# Patient Record
Sex: Male | Born: 1965
Health system: Southern US, Community
[De-identification: ages and names within clinical notes are randomized; demographics above are authoritative.]

## PROBLEM LIST (undated history)

## (undated) DIAGNOSIS — M5136 Other intervertebral disc degeneration, lumbar region: Secondary | ICD-10-CM

## (undated) DIAGNOSIS — F419 Anxiety disorder, unspecified: Secondary | ICD-10-CM

## (undated) DIAGNOSIS — M51369 Other intervertebral disc degeneration, lumbar region without mention of lumbar back pain or lower extremity pain: Secondary | ICD-10-CM

## (undated) DIAGNOSIS — L0292 Furuncle, unspecified: Secondary | ICD-10-CM

## (undated) DIAGNOSIS — F329 Major depressive disorder, single episode, unspecified: Secondary | ICD-10-CM

## (undated) DIAGNOSIS — M5126 Other intervertebral disc displacement, lumbar region: Secondary | ICD-10-CM

## (undated) DIAGNOSIS — F32A Depression, unspecified: Secondary | ICD-10-CM

## (undated) HISTORY — DX: Other intervertebral disc displacement, lumbar region: M51.26

## (undated) HISTORY — DX: Depression, unspecified: F32.A

## (undated) HISTORY — DX: Major depressive disorder, single episode, unspecified: F32.9

## (undated) HISTORY — PX: URETHRA SURGERY: SHX824

## (undated) HISTORY — DX: Furuncle, unspecified: L02.92

## (undated) HISTORY — DX: Other intervertebral disc degeneration, lumbar region: M51.36

## (undated) HISTORY — DX: Other intervertebral disc degeneration, lumbar region without mention of lumbar back pain or lower extremity pain: M51.369

## (undated) HISTORY — DX: Anxiety disorder, unspecified: F41.9

---

## 2009-08-24 ENCOUNTER — Emergency Department: Payer: Self-pay | Admitting: Emergency Medicine

## 2010-11-05 IMAGING — CT CT ABD-PELV W/ CM
1 of 3 series · 14 of 32 positions shown, 19 images · non-contrast
Comparison: none

REASON FOR EXAM: (1) RLQ abd pain; (2) Right pelvic pain
COMMENTS:

[Series 2: soft tissue · axial · 0.74mm/px · z∈[-498,-92]mm · 14 of 93 slices shown, 19 images]
[im 6/93  soft-tissue]
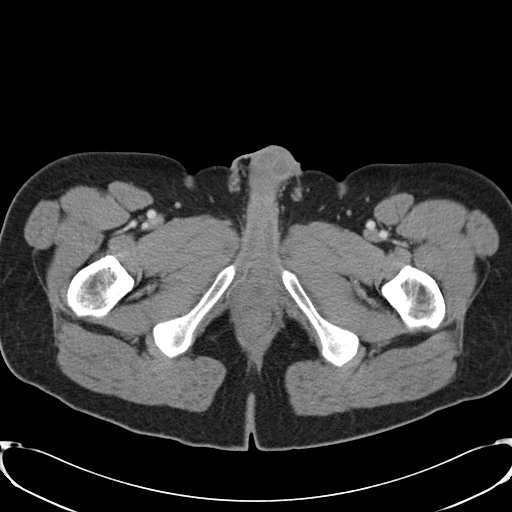
[im 6/93  bone]
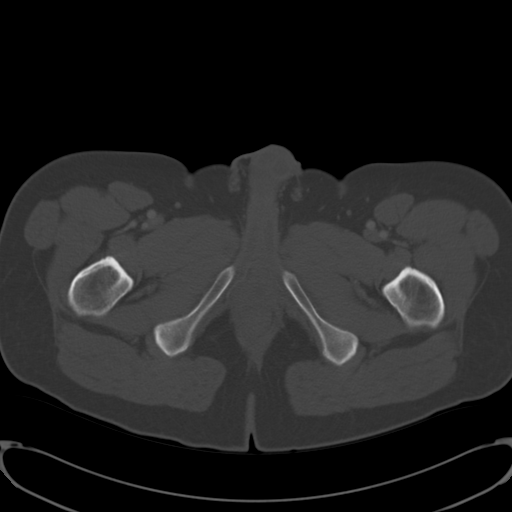
[im 11/93  soft-tissue]
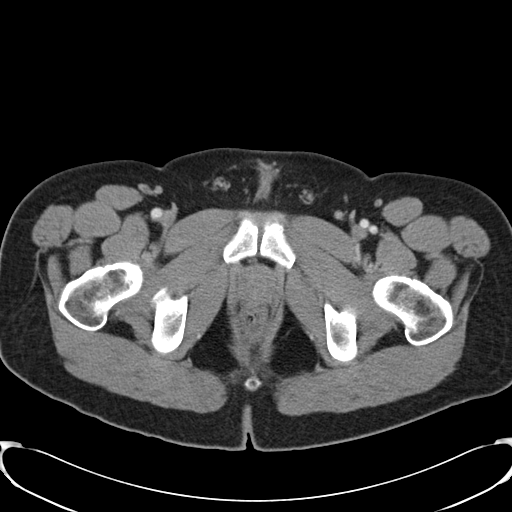
[im 22/93  soft-tissue]
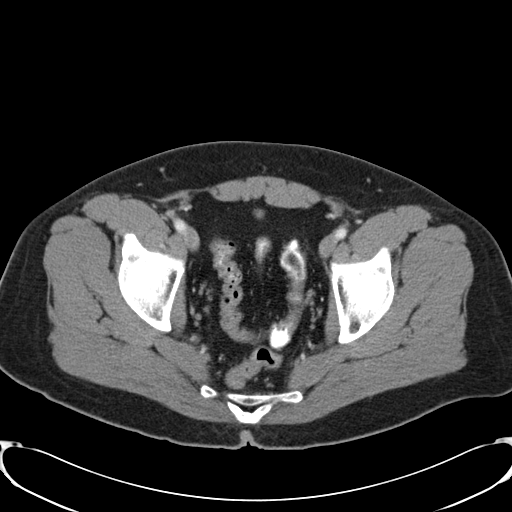
[im 28/93  soft-tissue]
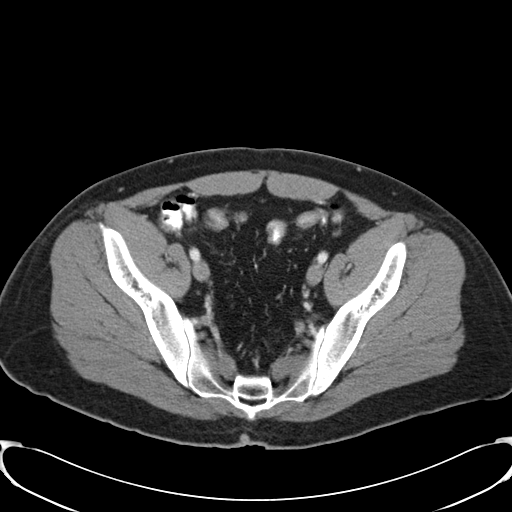
[im 33/93  soft-tissue]
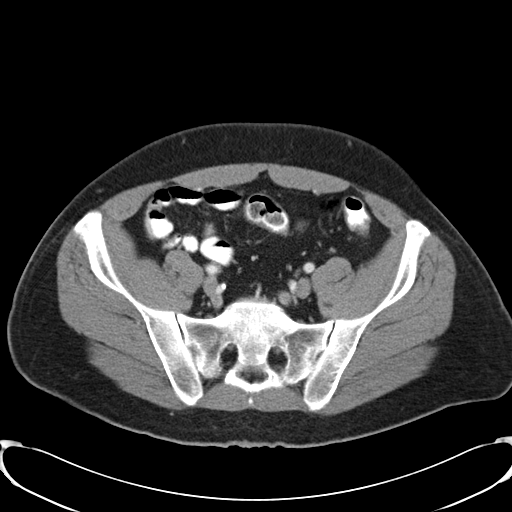
[im 38/93  soft-tissue]
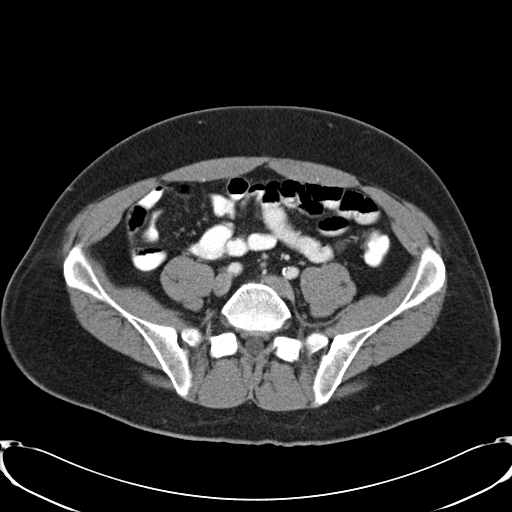
[im 49/93  soft-tissue]
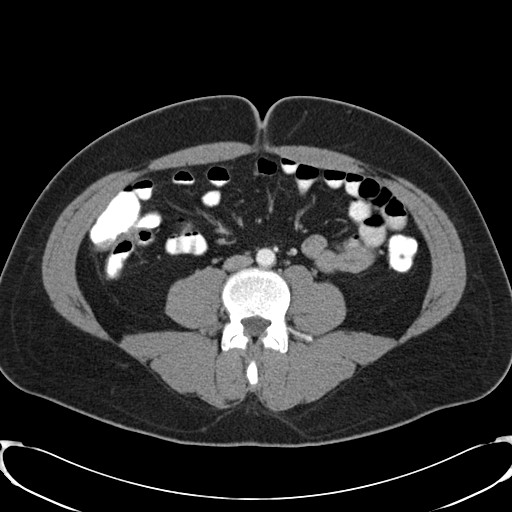
[im 55/93  soft-tissue]
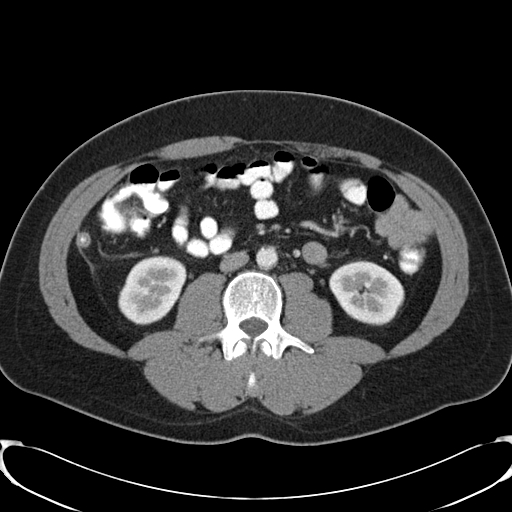
[im 60/93  soft-tissue]
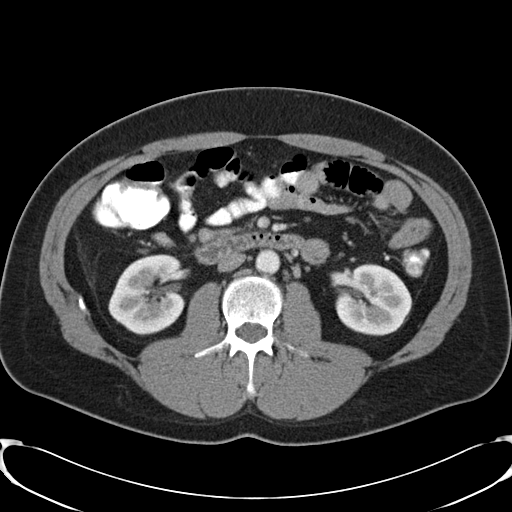
[im 60/93  bone]
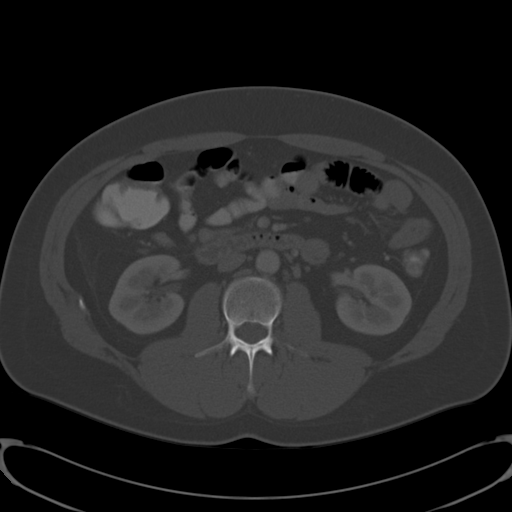
[im 65/93  soft-tissue]
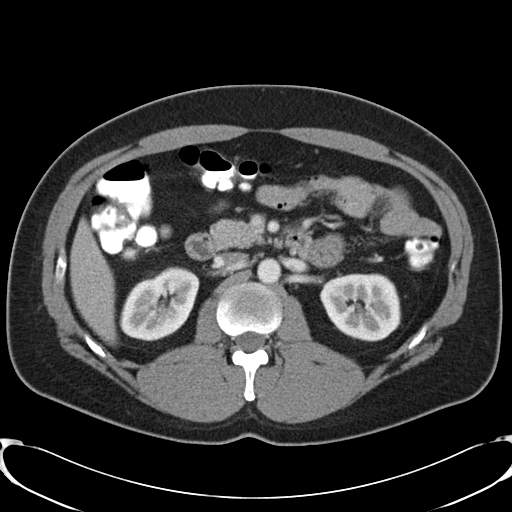
[im 71/93  soft-tissue]
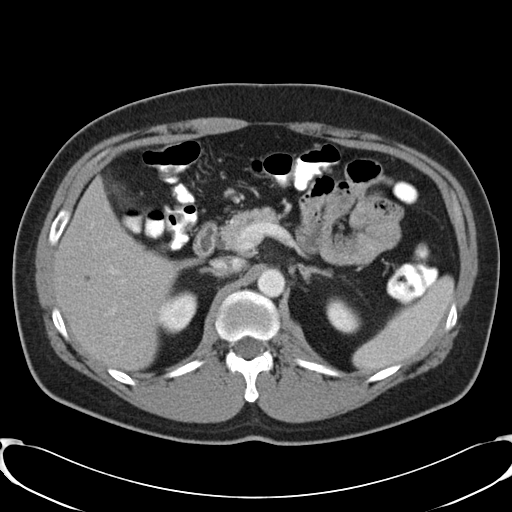
[im 71/93  lung]
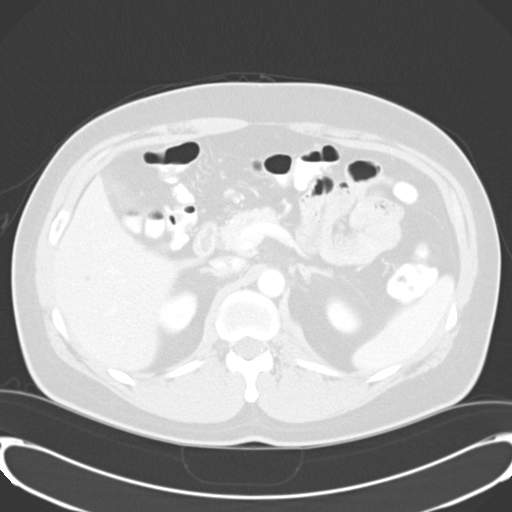
[im 76/93  lung]
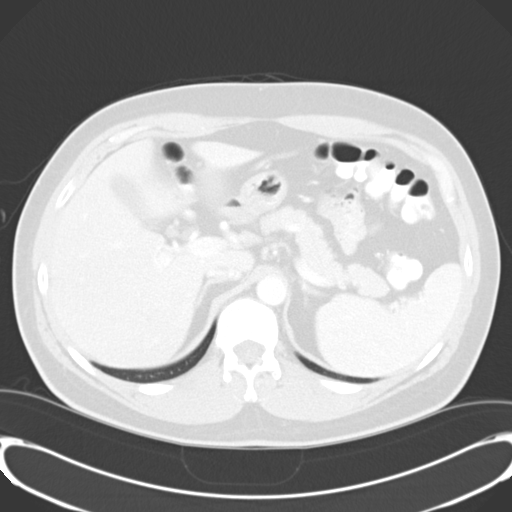
[im 82/93  soft-tissue]
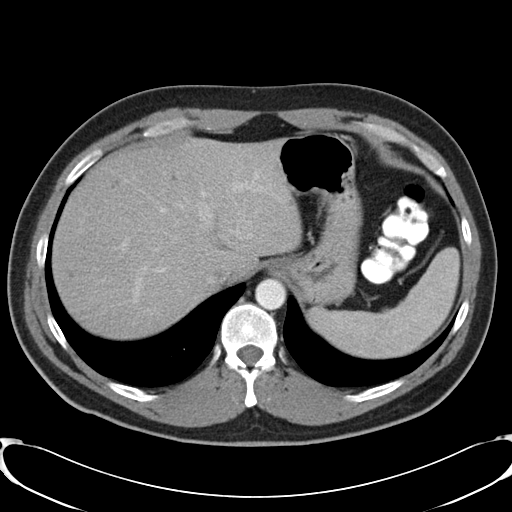
[im 82/93  lung]
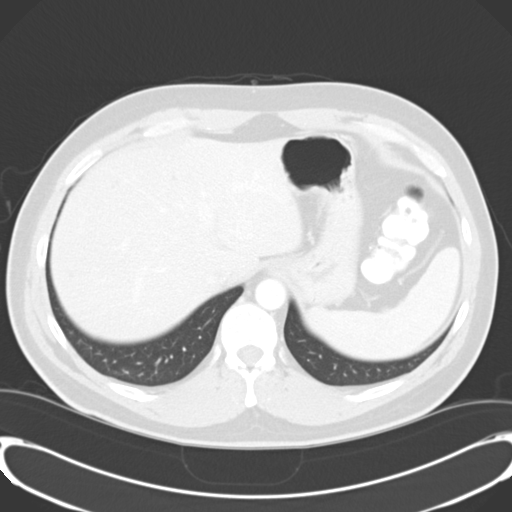
[im 87/93  soft-tissue]
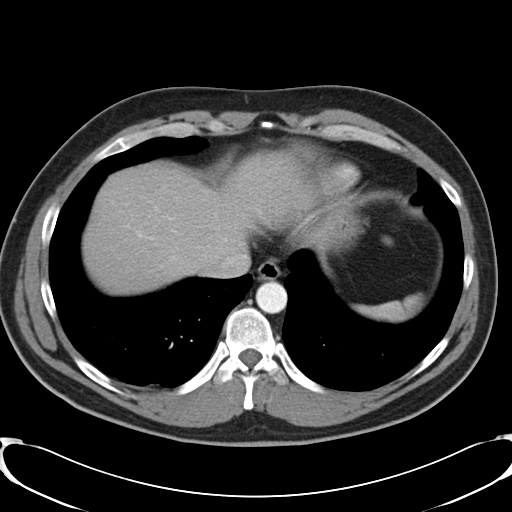
[im 87/93  lung]
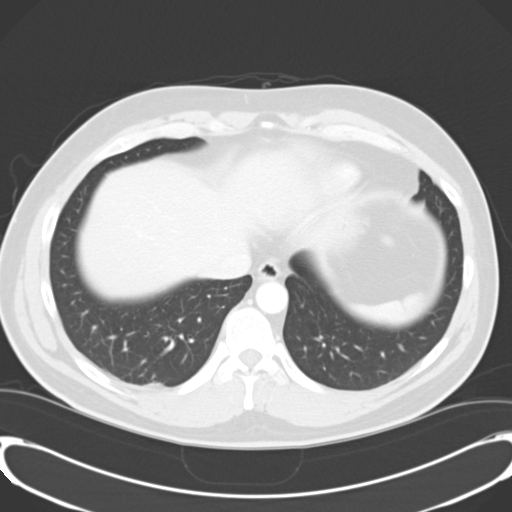

[14 of 32 positions shown; findings below may reference images not displayed]

PROCEDURE:     CT  - CT ABDOMEN / PELVIS  W  - August 24, 2009  [DATE]

RESULT:     History: Right-sided pain.

Procedure and Findings: Standard IV contrast enhanced CT of the pelvis
obtained using 100 cc of [id number]. Innumerable hepatic lucencies noted
most consistent with cysts given the degree of lucency. Followup scans can
be obtained. Spleen is normal. Pancreas normal. Adrenals normal. Kidneys are
normal. Mild fullness of the appendix noted. Contrast versus appendicolith
noted within the appendix. Density noted within the appendix is most likely
contrast as it appears to empty on delayed images. Subtle appendicitis
cannot be excluded.
IMPRESSION: Very subtle changes of appendicitis cannot be entirely
excluded followup scan and clinical evaluation is suggested. See complete
discussion above.

## 2014-04-17 ENCOUNTER — Encounter (HOSPITAL_COMMUNITY): Payer: Self-pay

## 2014-04-17 ENCOUNTER — Other Ambulatory Visit (HOSPITAL_COMMUNITY): Payer: 59 | Attending: Psychiatry | Admitting: Psychiatry

## 2014-04-17 DIAGNOSIS — F4323 Adjustment disorder with mixed anxiety and depressed mood: Secondary | ICD-10-CM

## 2014-04-17 DIAGNOSIS — G4700 Insomnia, unspecified: Secondary | ICD-10-CM | POA: Insufficient documentation

## 2014-04-17 DIAGNOSIS — Z598 Other problems related to housing and economic circumstances: Secondary | ICD-10-CM | POA: Insufficient documentation

## 2014-04-17 DIAGNOSIS — F909 Attention-deficit hyperactivity disorder, unspecified type: Secondary | ICD-10-CM | POA: Insufficient documentation

## 2014-04-17 DIAGNOSIS — F17200 Nicotine dependence, unspecified, uncomplicated: Secondary | ICD-10-CM | POA: Insufficient documentation

## 2014-04-17 DIAGNOSIS — F411 Generalized anxiety disorder: Secondary | ICD-10-CM | POA: Insufficient documentation

## 2014-04-17 DIAGNOSIS — Z5987 Material hardship due to limited financial resources, not elsewhere classified: Secondary | ICD-10-CM | POA: Insufficient documentation

## 2014-04-17 DIAGNOSIS — F332 Major depressive disorder, recurrent severe without psychotic features: Secondary | ICD-10-CM | POA: Insufficient documentation

## 2014-04-17 DIAGNOSIS — F913 Oppositional defiant disorder: Secondary | ICD-10-CM | POA: Insufficient documentation

## 2014-04-17 NOTE — Progress Notes (Signed)
    Daily Group Progress Note  Program: IOP  Group Time: 9:00-10:30  Participation Level: Active  Behavioral Response: Appropriate  Type of Therapy:  Group Therapy  Summary of Progress: Pt. Met with case manager and psychiatrist.     Group Time: 10:30-12:00  Participation Level:  Active  Behavioral Response: Appropriate  Type of Therapy: Psycho-education Group  Summary of Progress: Pt. Met with case manager and psychiatrist.  [facility]

## 2014-04-17 NOTE — Progress Notes (Signed)
Donald Farmer is a 48 y.o., divorced, Caucasian, male who was referred per therapist Gunnar Fusi Pile, [facility]), treatment for ongoing depressive and anxiety symptoms.  Symptoms include:  Decreased appetite (lost 50 lbs since January 2015), poor sleep (self medicating with THC), decreased concentration, anhedonia, fidgety, crying spells, irritability, and feelings of hopelessness and helplessness.  Denies SI/HI or A/V hallucinations.  Reports struggling with ongoing symptoms since January 2015.  Stressors:  1)  Unresolved grief/loss issues:  Relationship ended with girlfriend of 8 1/2 yrs.  States he was accused of having an affair.  2)  Conflictual relationship with ex-wife.  They divorced in 2000.  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  Pt hasn't worked since 11-26-13.  Reports that there was a lot of stress at his job.  States his caseload was increased and he wasn't meeting his quota.   Pt mentioned that he was admitted on a psychiatric unit for rehab during his teenage years.  States he was court ordered to attend an anger management class in 2000.  Has been seeing Wallie Char, [facility] since January 2015.  Has previously seen Dr. Jannifer Franklin on an outpatient basis.   Childhood:  Pt was adopted along with his sister.  "Decent childhood."  States mother was overly protective and an Scientist, forensic.  "She was big into image and perception."  Pt states he was very hyper as a child.  Completed up to ninth grade; eventually received GED. Sibling:  Younger sister (estranged) Kids:  53 yr old daughter, who resides with him. Pt admits to [employer] (one bowl) nightly to assist with sleep.  Denies any other drugs/ETOH.  Smokes two packs of cigarettes daily.  A:  Pt was oriented and provided an orientation folder.  After meeting with case manager and Dr. Rutherford Limerick, pt declined MH-IOP.  States he wanted a referral to an outpatient psychiatrist.  Referred pt to Dr. Providence Crosby, NP.  Encouraged pt to f/u with Wallie Char, LPC.  Also, encouraged support  groups.  Writer will contact [employer], LPC to inform her.  R:  Pt receptive.

## 2014-04-18 ENCOUNTER — Other Ambulatory Visit (HOSPITAL_COMMUNITY): Payer: 59 | Admitting: Psychiatry

## 2014-04-18 NOTE — Addendum Note (Signed)
Addended by: Margit Banda D on: 04/18/2014 12:49 PM   Modules accepted: Orders
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
## 2014-04-21 ENCOUNTER — Other Ambulatory Visit (HOSPITAL_COMMUNITY): Payer: 59

## 2014-04-22 ENCOUNTER — Other Ambulatory Visit (HOSPITAL_COMMUNITY): Payer: 59

## 2014-04-23 ENCOUNTER — Other Ambulatory Visit (HOSPITAL_COMMUNITY): Payer: 59

## 2014-04-24 ENCOUNTER — Other Ambulatory Visit (HOSPITAL_COMMUNITY): Payer: 59

## 2014-04-25 ENCOUNTER — Other Ambulatory Visit (HOSPITAL_COMMUNITY): Payer: 59

## 2014-04-28 ENCOUNTER — Other Ambulatory Visit (HOSPITAL_COMMUNITY): Payer: 59

## 2014-04-29 ENCOUNTER — Other Ambulatory Visit (HOSPITAL_COMMUNITY): Payer: 59

## 2014-04-30 ENCOUNTER — Other Ambulatory Visit (HOSPITAL_COMMUNITY): Payer: 59

## 2014-05-01 ENCOUNTER — Other Ambulatory Visit (HOSPITAL_COMMUNITY): Payer: 59

## 2014-05-02 ENCOUNTER — Other Ambulatory Visit (HOSPITAL_COMMUNITY): Payer: 59

## 2014-05-05 ENCOUNTER — Other Ambulatory Visit (HOSPITAL_COMMUNITY): Payer: 59

## 2014-05-06 ENCOUNTER — Other Ambulatory Visit (HOSPITAL_COMMUNITY): Payer: 59

## 2014-05-07 ENCOUNTER — Other Ambulatory Visit (HOSPITAL_COMMUNITY): Payer: 59

## 2014-05-08 ENCOUNTER — Other Ambulatory Visit (HOSPITAL_COMMUNITY): Payer: 59

## 2014-05-09 ENCOUNTER — Other Ambulatory Visit (HOSPITAL_COMMUNITY): Payer: 59

## 2014-05-12 ENCOUNTER — Other Ambulatory Visit (HOSPITAL_COMMUNITY): Payer: 59

## 2014-05-13 ENCOUNTER — Other Ambulatory Visit (HOSPITAL_COMMUNITY): Payer: 59

## 2014-05-14 ENCOUNTER — Other Ambulatory Visit (HOSPITAL_COMMUNITY): Payer: 59

## 2014-05-15 ENCOUNTER — Other Ambulatory Visit (HOSPITAL_COMMUNITY): Payer: 59

## 2014-05-16 ENCOUNTER — Other Ambulatory Visit (HOSPITAL_COMMUNITY): Payer: 59

## 2014-05-19 ENCOUNTER — Other Ambulatory Visit (HOSPITAL_COMMUNITY): Payer: 59

## 2014-05-20 ENCOUNTER — Other Ambulatory Visit (HOSPITAL_COMMUNITY): Payer: 59

## 2014-05-21 ENCOUNTER — Other Ambulatory Visit (HOSPITAL_COMMUNITY): Payer: 59

## 2014-05-22 ENCOUNTER — Other Ambulatory Visit (HOSPITAL_COMMUNITY): Payer: 59

## 2014-05-26 ENCOUNTER — Other Ambulatory Visit (HOSPITAL_COMMUNITY): Payer: 59

## 2014-05-27 ENCOUNTER — Other Ambulatory Visit (HOSPITAL_COMMUNITY): Payer: 59

## 2014-05-28 ENCOUNTER — Other Ambulatory Visit (HOSPITAL_COMMUNITY): Payer: 59

## 2014-05-30 ENCOUNTER — Ambulatory Visit (HOSPITAL_COMMUNITY): Payer: 59 | Admitting: Psychiatry

## 2015-11-22 HISTORY — PX: ORIF PROXIMAL TIBIAL PLATEAU FRACTURE: SUR953

## 2015-11-22 HISTORY — PX: CLOSED REDUCTION CLAVICLE FRACTURE: SUR253

## 2019-09-02 ENCOUNTER — Ambulatory Visit: Payer: Self-pay | Admitting: Family Medicine

## 2019-09-02 DIAGNOSIS — Z0289 Encounter for other administrative examinations: Secondary | ICD-10-CM

## 2020-09-23 DIAGNOSIS — J029 Acute pharyngitis, unspecified: Secondary | ICD-10-CM | POA: Diagnosis not present

## 2020-09-23 DIAGNOSIS — R070 Pain in throat: Secondary | ICD-10-CM | POA: Diagnosis not present

## 2020-10-28 DIAGNOSIS — K439 Ventral hernia without obstruction or gangrene: Secondary | ICD-10-CM | POA: Diagnosis not present

## 2020-10-28 DIAGNOSIS — R1032 Left lower quadrant pain: Secondary | ICD-10-CM | POA: Diagnosis not present

## 2020-11-04 ENCOUNTER — Encounter: Payer: Self-pay | Admitting: Family Medicine

## 2020-11-04 ENCOUNTER — Other Ambulatory Visit: Payer: Self-pay

## 2020-11-04 ENCOUNTER — Ambulatory Visit: Payer: Self-pay | Admitting: Family Medicine

## 2020-11-04 ENCOUNTER — Ambulatory Visit (INDEPENDENT_AMBULATORY_CARE_PROVIDER_SITE_OTHER): Payer: BC Managed Care – PPO | Admitting: Family Medicine

## 2020-11-04 VITALS — BP 112/64 | HR 78 | Temp 96.9°F | Ht 67.75 in | Wt 178.3 lb

## 2020-11-04 DIAGNOSIS — F17200 Nicotine dependence, unspecified, uncomplicated: Secondary | ICD-10-CM

## 2020-11-04 DIAGNOSIS — Z0000 Encounter for general adult medical examination without abnormal findings: Secondary | ICD-10-CM | POA: Diagnosis not present

## 2020-11-04 DIAGNOSIS — F17210 Nicotine dependence, cigarettes, uncomplicated: Secondary | ICD-10-CM | POA: Insufficient documentation

## 2020-11-04 DIAGNOSIS — T148XXA Other injury of unspecified body region, initial encounter: Secondary | ICD-10-CM | POA: Insufficient documentation

## 2020-11-04 DIAGNOSIS — L0292 Furuncle, unspecified: Secondary | ICD-10-CM

## 2020-11-04 DIAGNOSIS — Z113 Encounter for screening for infections with a predominantly sexual mode of transmission: Secondary | ICD-10-CM | POA: Diagnosis not present

## 2020-11-04 LAB — CBC WITH DIFFERENTIAL/PLATELET
Basophils Absolute: 0.1 10*3/uL (ref 0.0–0.1)
Basophils Relative: 0.9 % (ref 0.0–3.0)
Eosinophils Absolute: 0.1 10*3/uL (ref 0.0–0.7)
Eosinophils Relative: 1.7 % (ref 0.0–5.0)
HCT: 43 % (ref 39.0–52.0)
Hemoglobin: 14.6 g/dL (ref 13.0–17.0)
Lymphocytes Relative: 25.5 % (ref 12.0–46.0)
Lymphs Abs: 2.2 10*3/uL (ref 0.7–4.0)
MCHC: 33.9 g/dL (ref 30.0–36.0)
MCV: 95.4 fl (ref 78.0–100.0)
Monocytes Absolute: 0.5 10*3/uL (ref 0.1–1.0)
Monocytes Relative: 6.2 % (ref 3.0–12.0)
Neutro Abs: 5.7 10*3/uL (ref 1.4–7.7)
Neutrophils Relative %: 65.7 % (ref 43.0–77.0)
Platelets: 204 10*3/uL (ref 150.0–400.0)
RBC: 4.5 Mil/uL (ref 4.22–5.81)
RDW: 13.2 % (ref 11.5–15.5)
WBC: 8.7 10*3/uL (ref 4.0–10.5)

## 2020-11-04 LAB — LIPID PANEL
Cholesterol: 192 mg/dL (ref 0–200)
HDL: 32.3 mg/dL — ABNORMAL LOW (ref >39.00)
LDL Cholesterol: 148 mg/dL — ABNORMAL HIGH (ref 0–99)
NonHDL: 160.05
Total CHOL/HDL Ratio: 6
Triglycerides: 59 mg/dL (ref 0.0–149.0)
VLDL: 11.8 mg/dL (ref 0.0–40.0)

## 2020-11-04 LAB — COMPREHENSIVE METABOLIC PANEL
ALT: 9 U/L (ref 0–53)
AST: 11 U/L (ref 0–37)
Albumin: 4.1 g/dL (ref 3.5–5.2)
Alkaline Phosphatase: 60 U/L (ref 39–117)
BUN: 10 mg/dL (ref 6–23)
CO2: 28 mEq/L (ref 19–32)
Calcium: 9 mg/dL (ref 8.4–10.5)
Chloride: 105 mEq/L (ref 96–112)
Creatinine, Ser: 0.89 mg/dL (ref 0.40–1.50)
GFR: 98.18 mL/min (ref >60.00)
Glucose, Bld: 84 mg/dL (ref 70–99)
Potassium: 4.2 mEq/L (ref 3.5–5.1)
Sodium: 139 mEq/L (ref 135–145)
Total Bilirubin: 0.4 mg/dL (ref 0.2–1.2)
Total Protein: 6.5 g/dL (ref 6.0–8.3)

## 2020-11-04 LAB — TSH: TSH: 1.5 u[IU]/mL (ref 0.35–4.50)

## 2020-11-04 MED ORDER — CLINDAMYCIN PHOSPHATE 1 % EX SOLN: Freq: Two times a day (BID) | CUTANEOUS | 3 refills | Status: DC

## 2020-11-04 NOTE — Assessment & Plan Note (Signed)
Recurrent boils -has been seen at [facility] in past  Groin /buttocks  Exam consistent with hydradenitis  Px clindamycin solution to use bid (per pt this helps a lot)  Urged him to keep derm appt in April to re est as well  inst to cal if he develops worse symptoms

## 2020-11-04 NOTE — Assessment & Plan Note (Signed)
Would like to someday quit Allergic to wellbutrin   No s/s of copd

## 2020-11-04 NOTE — Assessment & Plan Note (Signed)
Pt went [facility] for [facility] in L low abdomen after stretching There was a ? Of hernia and [place] was going to be ordered  Now symptoms are better and nl exam (unable to palp a hernia)  Disc poss of muscle strain  Will go back to nl activities and watch this If pain returns or any bulge will plan on [place]

## 2020-11-04 NOTE — Assessment & Plan Note (Signed)
HIV and hep C and gc/chlam today

## 2020-11-04 NOTE — Assessment & Plan Note (Signed)
Will plan to return for PE Labs done today

## 2020-11-04 NOTE — Addendum Note (Signed)
Addended by: Alvina Chou on: 11/04/2020 02:33 PM   Modules accepted: Orders

## 2020-11-04 NOTE — Patient Instructions (Addendum)
Labs today for wellness and std screening   I sent in the clindamycin solution   If the abdominal issue re occurs then let us know  For now let's watch it   take care of yourself   When you check out make an appt for health mt exam
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
## 2020-11-05 ENCOUNTER — Other Ambulatory Visit: Payer: BC Managed Care – PPO

## 2020-11-05 DIAGNOSIS — Z113 Encounter for screening for infections with a predominantly sexual mode of transmission: Secondary | ICD-10-CM | POA: Diagnosis not present

## 2020-11-05 LAB — HIV ANTIBODY (ROUTINE TESTING W REFLEX): HIV 1&2 Ab, 4th Generation: NONREACTIVE

## 2020-11-05 LAB — HEPATITIS C ANTIBODY
Hepatitis C Ab: NONREACTIVE
SIGNAL TO CUT-OFF: 0.02 (ref <1.00)

## 2020-11-06 LAB — C. TRACHOMATIS/N. GONORRHOEAE RNA
C. trachomatis RNA, TMA: NOT DETECTED
N. gonorrhoeae RNA, TMA: NOT DETECTED

## 2020-12-08 ENCOUNTER — Encounter: Payer: BC Managed Care – PPO | Admitting: Family Medicine

## 2020-12-14 ENCOUNTER — Encounter: Payer: BC Managed Care – PPO | Admitting: Family Medicine

## 2021-02-22 ENCOUNTER — Telehealth: Payer: Self-pay | Admitting: Family Medicine

## 2021-02-22 DIAGNOSIS — L0292 Furuncle, unspecified: Secondary | ICD-10-CM

## 2021-02-22 MED ORDER — CLINDAMYCIN PHOSPHATE 1 % EX LOTN: TOPICAL_LOTION | Freq: Two times a day (BID) | CUTANEOUS | 5 refills | Status: DC

## 2021-02-22 NOTE — Telephone Encounter (Signed)
Pt called in due to he normally receives the solution (clindamycin) but wanted to know about getting the lotion for the deeper issue.  Please advise

## 2021-02-22 NOTE — Telephone Encounter (Signed)
Left Vm letting pt know Rx sent

## 2021-02-22 NOTE — Telephone Encounter (Signed)
I sent it  Hope this works well

## 2021-09-06 ENCOUNTER — Emergency Department (HOSPITAL_BASED_OUTPATIENT_CLINIC_OR_DEPARTMENT_OTHER)
Admission: EM | Admit: 2021-09-06 | Discharge: 2021-09-06 | Disposition: A | Discharge status: Home / Self Care | Payer: Commercial Managed Care - PPO | Attending: Emergency Medicine | Admitting: Emergency Medicine

## 2021-09-06 ENCOUNTER — Encounter (HOSPITAL_BASED_OUTPATIENT_CLINIC_OR_DEPARTMENT_OTHER): Payer: Self-pay | Admitting: Emergency Medicine

## 2021-09-06 ENCOUNTER — Other Ambulatory Visit: Payer: Self-pay

## 2021-09-06 ENCOUNTER — Emergency Department (HOSPITAL_BASED_OUTPATIENT_CLINIC_OR_DEPARTMENT_OTHER): Payer: Commercial Managed Care - PPO | Admitting: Radiology

## 2021-09-06 ENCOUNTER — Telehealth: Payer: Self-pay

## 2021-09-06 DIAGNOSIS — R0602 Shortness of breath: Secondary | ICD-10-CM | POA: Insufficient documentation

## 2021-09-06 DIAGNOSIS — R079 Chest pain, unspecified: Secondary | ICD-10-CM | POA: Diagnosis not present

## 2021-09-06 DIAGNOSIS — F17210 Nicotine dependence, cigarettes, uncomplicated: Secondary | ICD-10-CM | POA: Diagnosis not present

## 2021-09-06 LAB — CBC WITH DIFFERENTIAL/PLATELET
Abs Immature Granulocytes: 0.03 10*3/uL (ref 0.00–0.07)
Basophils Absolute: 0.1 10*3/uL (ref 0.0–0.1)
Basophils Relative: 1 %
Eosinophils Absolute: 0.2 10*3/uL (ref 0.0–0.5)
Eosinophils Relative: 2 %
HCT: 42.2 % (ref 39.0–52.0)
Hemoglobin: 14.3 g/dL (ref 13.0–17.0)
Immature Granulocytes: 0 %
Lymphocytes Relative: 32 %
Lymphs Abs: 3 10*3/uL (ref 0.7–4.0)
MCH: 31.8 pg (ref 26.0–34.0)
MCHC: 33.9 g/dL (ref 30.0–36.0)
MCV: 94 fL (ref 80.0–100.0)
Monocytes Absolute: 0.5 10*3/uL (ref 0.1–1.0)
Monocytes Relative: 5 %
Neutro Abs: 5.7 10*3/uL (ref 1.7–7.7)
Neutrophils Relative %: 60 %
Platelets: 199 10*3/uL (ref 150–400)
RBC: 4.49 MIL/uL (ref 4.22–5.81)
RDW: 12.9 % (ref 11.5–15.5)
WBC: 9.5 10*3/uL (ref 4.0–10.5)
nRBC: 0 % (ref 0.0–0.2)

## 2021-09-06 LAB — BASIC METABOLIC PANEL
Anion gap: 8 (ref 5–15)
BUN: 18 mg/dL (ref 6–20)
CO2: 26 mmol/L (ref 22–32)
Calcium: 9.3 mg/dL (ref 8.9–10.3)
Chloride: 104 mmol/L (ref 98–111)
Creatinine, Ser: 0.99 mg/dL (ref 0.61–1.24)
GFR, Estimated: >60 mL/min (ref >60)
Glucose, Bld: 85 mg/dL (ref 70–99)
Potassium: 3.9 mmol/L (ref 3.5–5.1)
Sodium: 138 mmol/L (ref 135–145)

## 2021-09-06 LAB — TROPONIN I (HIGH SENSITIVITY)
Troponin I (High Sensitivity): 2 ng/L (ref <18)
Troponin I (High Sensitivity): 2 ng/L (ref <18)

## 2021-09-06 LAB — D-DIMER, QUANTITATIVE: D-Dimer, Quant: 0.3 ug/mL-FEU (ref 0.00–0.50)

## 2021-09-06 NOTE — ED Notes (Signed)
When this RN walked in room seen patient eating [employer] fast food meal. Instructed pt to be NPO until tests done and resulted and cleared by MD.  Pt states understanding and food removed by s/o at bedside.

## 2021-09-06 NOTE — Discharge Instructions (Addendum)
Follow-up with your primary care doctor within the next few days.  Return here as needed if you have any worsening symptoms.

## 2021-09-06 NOTE — ED Notes (Signed)
Patient transported to X-ray 

## 2021-09-06 NOTE — ED Triage Notes (Signed)
Pt from Home. c/o of chest pain that started at 0500 this morning. Pt states that his chest hurts when he takes a deep breath, 3 out of 10. Pt states that it feels like he has done something to his rib. Pt also complains of intermittent SOB.

## 2021-09-06 NOTE — ED Notes (Signed)
ED Provider at bedside. 

## 2021-09-06 NOTE — Telephone Encounter (Signed)
Aware, will watch for correspondence  

## 2021-09-06 NOTE — ED Provider Notes (Signed)
Patient care was taken over from Dr. Stevie Kern.  Patient presented with some chest pain which is sharp and worse with deep breathing.  He has no hypoxia.  No significant ongoing pain.  Chest x-ray was clear.  D-dimer was negative.  He has had 2 negative troponins.  He was discharged home in good condition.  Encouraged him to have close follow-up with his PCP for recheck.  Return precautions were given.   Rolan Bucco, MD 09/06/21 [phone]

## 2021-09-06 NOTE — Telephone Encounter (Signed)
I spoke with pt and he was just arriving at [facility] ED. Sending note to Dr Milinda Antis and Gore CMA.

## 2021-09-06 NOTE — ED Provider Notes (Signed)
[facility] EMERGENCY DEPT Provider Note   CSN: [id number] Arrival date & time: 09/06/21  1158     History No chief complaint on file.   Donald Farmer is a 55 y.o. male.  Presenting to ER with concern for chest pain, shortness of breath.  Patient reports that pain started this morning, states that its worse with deep breathing, not associated with exertion, occurring at rest.  Currently 3 out of 10 in severity, sharp and achy.  Has history of smoking, denies any known medical problems.  He is adopted and is not aware of his family medical history.  No cough, no recent illnesses, no fevers.  No leg pain or leg swelling.  HPI     Past Medical History:  Diagnosis Date   Anxiety    Boil    Bulging lumbar disc    Depression     Patient Active Problem List   Diagnosis Date Noted   Boils 11/04/2020   Screen for STD (sexually transmitted disease) 11/04/2020   Pulled muscle 11/04/2020   Routine general medical examination at a health care facility 11/04/2020   Smoking 11/04/2020    Past Surgical History:  Procedure Laterality Date   CLOSED REDUCTION CLAVICLE FRACTURE  2017   ORIF PROXIMAL TIBIAL PLATEAU FRACTURE  2017   URETHRA SURGERY     around age 40       History reviewed. No pertinent family history.  Social History   Tobacco Use   Smoking status: Every Day    Packs/day: 1.50    Types: Cigarettes   Smokeless tobacco: Never  Substance Use Topics   Alcohol use: No   Drug use: Yes    Types: Marijuana    Home Medications Prior to Admission medications   Medication Sig Start Date End Date Taking? Authorizing Provider  clindamycin (CLEOCIN T) 1 % lotion Apply topically 2 (two) times daily. To affected areas 02/22/21   Tower, Audrie Gallus, MD    Allergies    Wellbutrin [bupropion]  Review of Systems   Review of Systems  Constitutional:  Negative for chills and fever.  HENT:  Negative for ear pain and sore throat.   Eyes:  Negative for pain and  visual disturbance.  Respiratory:  Positive for shortness of breath. Negative for cough.   Cardiovascular:  Positive for chest pain. Negative for palpitations.  Gastrointestinal:  Negative for abdominal pain and vomiting.  Genitourinary:  Negative for dysuria and hematuria.  Musculoskeletal:  Negative for arthralgias and back pain.  Skin:  Negative for color change and rash.  Neurological:  Negative for seizures and syncope.  All other systems reviewed and are negative.  Physical Exam Updated Vital Signs BP 116/85   Pulse 67   Temp 98.4 F (36.9 C)   Resp (!) 30   Ht 5\' 9"  (1.753 m)   Wt 90.7 kg   SpO2 99%   BMI 29.53 kg/m   Physical Exam Vitals and nursing note reviewed.  Constitutional:      Appearance: He is well-developed.  HENT:     Head: Normocephalic and atraumatic.  Eyes:     Conjunctiva/sclera: Conjunctivae normal.  Cardiovascular:     Rate and Rhythm: Normal rate and regular rhythm.     Heart sounds: No murmur heard. Pulmonary:     Effort: Pulmonary effort is normal. No respiratory distress.     Breath sounds: Normal breath sounds.  Abdominal:     Palpations: Abdomen is soft.     Tenderness:  There is no abdominal tenderness.  Musculoskeletal:        General: No deformity or signs of injury.     Cervical back: Neck supple.  Skin:    General: Skin is warm and dry.  Neurological:     General: No focal deficit present.     Mental Status: He is alert.  Psychiatric:        Mood and Affect: Mood normal.    ED Results / Procedures / Treatments   Labs (all labs ordered are listed, but only abnormal results are displayed) Labs Reviewed  CBC WITH DIFFERENTIAL/PLATELET  BASIC METABOLIC PANEL  D-DIMER, QUANTITATIVE  TROPONIN I (HIGH SENSITIVITY)  TROPONIN I (HIGH SENSITIVITY)    EKG EKG Interpretation  Date/Time:  Monday September 06 2021 12:24:04 EDT Ventricular Rate:  76 PR Interval:  164 QRS Duration: 94 QT Interval:  378 QTC Calculation: 425 R  Axis:   5 Text Interpretation: Normal sinus rhythm with sinus arrhythmia Normal ECG Confirmed by Marianna Fuss ([id number]) on 09/06/2021 3:37:20 PM  Radiology DG Chest 2 View  Result Date: 09/06/2021 CLINICAL DATA:  Shortness of breath, chest pain EXAM: CHEST - 2 VIEW COMPARISON:  None. FINDINGS: The heart size and mediastinal contours are within normal limits. Both lungs are clear. Plate and screw fixation of the mid right clavicle. Multiple callused right-sided rib fractures. IMPRESSION: No acute abnormality of the lungs. Electronically Signed   By: Jearld Lesch M.D.   On: 09/06/2021 14:59    Procedures Procedures   Medications Ordered in ED Medications - No data to display  ED Course  I have reviewed the triage vital signs and the nursing notes.  Pertinent labs & imaging results that were available during my care of the patient were reviewed by me and considered in my medical decision making (see chart for details).    MDM Rules/Calculators/A&P                          55 year old male presents to ER with concern for chest pain, shortness of breath.  On exam he appears well in no distress.  Vitals are normal.  Will check labs, EKG, dimer.  EKG without acute ischemic change, initial troponin within normal limits, D-dimer within normal limits.  CXR clear.  Will check second troponin.  If repeat troponin stable, likely discharge home.  While awaiting repeat troponin and reassessment, signed out to Dr. Fredderick Phenix.  Final Clinical Impression(s) / ED Diagnoses Final diagnoses:  Chest pain, unspecified type    Rx / DC Orders ED Discharge Orders     None        Milagros Loll, MD 09/06/21 1538

## 2021-09-06 NOTE — Telephone Encounter (Signed)
[facility] Day - Client TELEPHONE ADVICE RECORD Access[facility]urse Patient Name: Donald Farmer Gender: Male DOB: 04-27-1966 Age: 55 Y 10 M 5 D Return Phone Number: [phone] (Primary) Address: City/ State/ Zip: [place] [address] Client [name] Day - Client Client Site [facility] - Day Physician AA - PHYSICIAN, Crissie Figures- MD Contact Type Call Who Is Calling Patient / Member / Family / Caregiver Call Type Triage / Clinical Relationship To Patient Self Return Phone Number [phone] (Primary) Chief Complaint CHEST PAIN - pain, pressure, heaviness or tightness Reason for Call Symptomatic / Request for Health Information Initial Comment Caller states difficulty breathing and chest pain, feels like hit in the chest. Normal fever, no other pain. Translation No Nurse Assessment Nurse: [name], RN, [name] Date/Time (Eastern Time): 09/06/2021 10:24:44 AM Confirm and document reason for call. If symptomatic, describe symptoms. ---Caller states difficulty breathing and chest pain, feels like hit in the chest. Normal fever, no other pain. Caller states that he woke up with it this morning. Does the patient have any new or worsening symptoms? ---Yes Will a triage be completed? ---Yes Related visit to physician within the last 2 weeks? ---No Does the PT have any chronic conditions? (i.e. diabetes, asthma, this includes High risk factors for pregnancy, etc.) ---Yes List chronic conditions. ---high cholesterol Is this a behavioral health or substance abuse call? ---No Guidelines Guideline Title Affirmed Question Affirmed Notes Nurse Date/Time [name] Chest Pain [1] Chest pain lasts > 5 minutes AND [2] age > 64 [name], RN, [name] 09/06/2021 10:26:36 AM Disp. Time [name] Disposition Final User 09/06/2021 10:21:48 AM Send to Urgent [name] 09/06/2021 10:31:48 AM 911  Outcome Documentation [name], RN, [name] PLEASE NOTE: All timestamps contained within this report are represented as [place] Standard Time. CONFIDENTIALTY NOTICE: This fax transmission is intended only for the addressee. It contains information that is legally privileged, confidential or otherwise protected from use or disclosure. If you are not the intended recipient, you are strictly prohibited from reviewing, disclosing, copying using or disseminating any of this information or taking any action in reliance on or regarding this information. If you have received this fax in error, please notify us immediately by telephone so that we can arrange for its return to [place]. Phone: [phone], Toll-Free: [phone], Fax: [phone] Page: 2 of 2 Call Id: [id number] Disp. Time (Eastern Time) Disposition Final User Reason: Caller states that he will go to the ED, but he will have his girlfriend take him. 09/06/2021 10:30:06 AM Call EMS 911 Now Yes [name], RN, [name] Caller Disagree/Comply Disagree Caller Understands Yes PreDisposition Call Doctor Care Advice Given Per Guideline CALL EMS 911 NOW: * Immediate medical attention is needed. You need to hang up and call 911 (or an ambulance). CARE ADVICE given per Chest Pain (Adult) guideline. Referrals Med [facility] - ED

## 2021-09-07 ENCOUNTER — Ambulatory Visit (INDEPENDENT_AMBULATORY_CARE_PROVIDER_SITE_OTHER): Payer: Commercial Managed Care - PPO | Admitting: Family Medicine

## 2021-09-07 ENCOUNTER — Encounter: Payer: Self-pay | Admitting: Family Medicine

## 2021-09-07 VITALS — BP 104/62 | HR 75 | Temp 98.2°F | Resp 18 | Ht 69.0 in | Wt 197.2 lb

## 2021-09-07 DIAGNOSIS — F17200 Nicotine dependence, unspecified, uncomplicated: Secondary | ICD-10-CM

## 2021-09-07 DIAGNOSIS — R0789 Other chest pain: Secondary | ICD-10-CM | POA: Insufficient documentation

## 2021-09-07 DIAGNOSIS — R0602 Shortness of breath: Secondary | ICD-10-CM | POA: Insufficient documentation

## 2021-09-07 MED ORDER — ALBUTEROL SULFATE HFA 108 (90 BASE) MCG/ACT IN AERS: 2.0000 | INHALATION_SPRAY | Freq: Four times a day (QID) | RESPIRATORY_TRACT | 3 refills | Status: DC | PRN

## 2021-09-07 NOTE — Assessment & Plan Note (Signed)
More sob/wheeze on exertion  occ smokers cough  Suspect some early copd  Rev cxr from ER recently  Ref to pulmonary for eval/ likely PFTs Albuterol mdi px for rescue/rev side effects

## 2021-09-07 NOTE — Assessment & Plan Note (Signed)
Seen in ER yesterday  Reviewed hospital records, lab results and studies in detail   Reassuring ekg and lab and cxr  Neg troponins and DD  Disc likely hood of MSK cause  Some lower rib tenderness bilat  Poss muscle strain vs costochondritis or arthritis  Handout given  Recommend nsaid prn for pain /inflammation  Warm compress or shower prn  Will monitor and f/u prn (consider further eval if not imp in a week)  Did also ref to pulmonary for likely copd  Plans to f/u here for PE when able, and disc risk factors for vascular dz  Watch for fever/ cough or other symptoms

## 2021-09-07 NOTE — Assessment & Plan Note (Signed)
Smoking for years  Has cut down from 3 ppd to 1 ppd  Ready to consider quitting  Was allergic to bupropoin in the past  Some sob with exertion and wheeze, suspect copd Ref done to pulmonary

## 2021-09-07 NOTE — Patient Instructions (Addendum)
An anti inflammatory like advil or aleve over the counter may be helpful if  Always take with food  Heat / warm shower may help also   If symptoms worsen or if not improving over the next week let us know  Or any new symptoms    I sent in a px for albuterol to use if you are shortness of breath and wheezing   I placed a referral for pulmonary- so you can have a visit to discuss smoking and copd and options for diagnosis and treatment   Schedule a health maintenance exam and we will discuss risk factors for vascular disease as well   Keep thinking about quitting smoking

## 2021-09-07 NOTE — Progress Notes (Signed)
Subjective:    Patient ID: Donald Farmer, male    DOB: 22-Aug-1966, 55 y.o.   MRN: [id number]  This visit occurred during the SARS-CoV-2 public health emergency.  Safety protocols were in place, including screening questions prior to the visit, additional usage of staff PPE, and extensive cleaning of exam room while observing appropriate contact time as indicated for disinfecting solutions.   HPI Pt presents for ER visit f/u for chest pain   Wt Readings from Last 3 Encounters:  09/07/21 197 lb 4 oz (89.5 kg)  09/06/21 200 lb (90.7 kg)  11/04/20 178 lb 5 oz (80.9 kg)   29.13 kg/m Pt presented to ER yesterday with som chest pain sharp in nature and triggered by deep breathing  No hypoxia Cxr was clear D dimer test negative and 2 neg troponins  Was d/c home  Woke up and he felt like he was kicked in chest   No fever   No new activity  No exercise program   Has felt just a little more sob for a while/ going up and down the stairs  Gets lightheaded when he gets up from sitting   Good fluid intake   Smoking history  Smoked since age 82  Down to 1 ppd    (at this highest 3 ppd )  ? If he has any copd   A little smoker's cough- not bad -more lately  At night can hear himself wheeze occ  Scant green sputum when he clears his throat   Today - pain is in lower ribs, front an back  Twinge into armpit /sore like a pulled muscle  Taking a deep breath makes it worse     BP Readings from Last 3 Encounters:  09/07/21 104/62  09/06/21 134/78  11/04/20 112/64   Pulse Readings from Last 3 Encounters:  09/07/21 75  09/06/21 (!) 55  11/04/20 78   Pulse ox 95% room air  DG Chest 2 View  Result Date: 09/06/2021 CLINICAL DATA:  Shortness of breath, chest pain EXAM: CHEST - 2 VIEW COMPARISON:  None. FINDINGS: The heart size and mediastinal contours are within normal limits. Both lungs are clear. Plate and screw fixation of the mid right clavicle. Multiple callused right-sided  rib fractures. IMPRESSION: No acute abnormality of the lungs. Electronically Signed   By: Jearld Lesch M.D.   On: 09/06/2021 14:59    Results for orders placed or performed during the hospital encounter of 09/06/21  CBC with Differential  Result Value Ref Range   WBC 9.5 4.0 - 10.5 K/uL   RBC 4.49 4.22 - 5.81 MIL/uL   Hemoglobin 14.3 13.0 - 17.0 g/dL   HCT 10.2 58.5 - 27.7 %   MCV 94.0 80.0 - 100.0 fL   MCH 31.8 26.0 - 34.0 pg   MCHC 33.9 30.0 - 36.0 g/dL   RDW 82.4 23.5 - 36.1 %   Platelets 199 150 - 400 K/uL   nRBC 0.0 0.0 - 0.2 %   Neutrophils Relative % 60 %   Neutro Abs 5.7 1.7 - 7.7 K/uL   Lymphocytes Relative 32 %   Lymphs Abs 3.0 0.7 - 4.0 K/uL   Monocytes Relative 5 %   Monocytes Absolute 0.5 0.1 - 1.0 K/uL   Eosinophils Relative 2 %   Eosinophils Absolute 0.2 0.0 - 0.5 K/uL   Basophils Relative 1 %   Basophils Absolute 0.1 0.0 - 0.1 K/uL   Immature Granulocytes 0 %   Abs Immature  Granulocytes 0.03 0.00 - 0.07 K/uL  Basic metabolic panel  Result Value Ref Range   Sodium 138 135 - 145 mmol/L   Potassium 3.9 3.5 - 5.1 mmol/L   Chloride 104 98 - 111 mmol/L   CO2 26 22 - 32 mmol/L   Glucose, Bld 85 70 - 99 mg/dL   BUN 18 6 - 20 mg/dL   Creatinine, Ser 4.91 0.61 - 1.24 mg/dL   Calcium 9.3 8.9 - 79.1 mg/dL   GFR, Estimated >50 >56 mL/min   Anion gap 8 5 - 15  D-dimer, quantitative  Result Value Ref Range   D-Dimer, Quant 0.30 0.00 - 0.50 ug/mL-FEU  Troponin I (High Sensitivity)  Result Value Ref Range   Troponin I (High Sensitivity) 2 <18 ng/L  Troponin I (High Sensitivity)  Result Value Ref Range   Troponin I (High Sensitivity) 2 <18 ng/L    Patient Active Problem List   Diagnosis Date Noted   Shortness of breath 09/07/2021   Chest wall pain 09/07/2021   Boils 11/04/2020   Screen for STD (sexually transmitted disease) 11/04/2020   Pulled muscle 11/04/2020   Routine general medical examination at a health care facility 11/04/2020   Smoking 11/04/2020    Past Medical History:  Diagnosis Date   Anxiety    Boil    Bulging lumbar disc    Depression    Past Surgical History:  Procedure Laterality Date   CLOSED REDUCTION CLAVICLE FRACTURE  2017   ORIF PROXIMAL TIBIAL PLATEAU FRACTURE  2017   URETHRA SURGERY     around age 67   Social History   Tobacco Use   Smoking status: Every Day    Packs/day: 1.50    Types: Cigarettes   Smokeless tobacco: Never  Substance Use Topics   Alcohol use: No   Drug use: Yes    Types: Marijuana   History reviewed. No pertinent family history. Allergies  Allergen Reactions   Wellbutrin [Bupropion] Rash   Current Outpatient Medications on File Prior to Visit  Medication Sig Dispense Refill   clindamycin (CLEOCIN T) 1 % lotion Apply topically 2 (two) times daily. To affected areas 60 mL 5   No current facility-administered medications on file prior to visit.    Review of Systems  Constitutional:  Negative for activity change, appetite change, fatigue, fever and unexpected weight change.  HENT:  Negative for congestion, rhinorrhea, sore throat and trouble swallowing.   Eyes:  Negative for pain, redness, itching and visual disturbance.  Respiratory:  Positive for cough and shortness of breath. Negative for apnea, choking, chest tightness and wheezing.   Cardiovascular:  Positive for chest pain. Negative for palpitations and leg swelling.  Gastrointestinal:  Negative for abdominal pain, blood in stool, constipation, diarrhea and nausea.  Endocrine: Negative for cold intolerance, heat intolerance, polydipsia and polyuria.  Genitourinary:  Negative for difficulty urinating, dysuria, frequency and urgency.  Musculoskeletal:  Negative for arthralgias, joint swelling and myalgias.  Skin:  Negative for pallor and rash.  Neurological:  Negative for dizziness, tremors, weakness, numbness and headaches.  Hematological:  Negative for adenopathy. Does not bruise/bleed easily.  Psychiatric/Behavioral:   Negative for decreased concentration and dysphoric mood. The patient is not nervous/anxious.       Objective:   Physical Exam Constitutional:      General: He is not in acute distress.    Appearance: Normal appearance. He is well-developed. He is obese. He is not ill-appearing or diaphoretic.  HENT:  Head: Normocephalic and atraumatic.  Eyes:     Conjunctiva/sclera: Conjunctivae normal.     Pupils: Pupils are equal, round, and reactive to light.  Neck:     Thyroid: No thyromegaly.     Vascular: No carotid bruit or JVD.  Cardiovascular:     Rate and Rhythm: Normal rate and regular rhythm.     Pulses: Normal pulses.     Heart sounds: Normal heart sounds.    No gallop.  Pulmonary:     Effort: Pulmonary effort is normal. No respiratory distress.     Breath sounds: Normal breath sounds. No stridor. No wheezing, rhonchi or rales.     Comments: Diffusely distant bs No wheeze even on forced espiration   Mild rib tenderness anteriorly , bilateral  Also lateral rib tenderness bilat No skin change No crepitus   Chest:     Chest wall: Tenderness present.  Abdominal:     General: Bowel sounds are normal. There is no distension or abdominal bruit.     Palpations: Abdomen is soft. There is no mass.     Tenderness: There is no abdominal tenderness.  Musculoskeletal:     Cervical back: Normal range of motion and neck supple.     Right lower leg: No edema.     Left lower leg: No edema.  Lymphadenopathy:     Cervical: No cervical adenopathy.  Skin:    General: Skin is warm and dry.     Coloration: Skin is not pale.     Findings: No rash.  Neurological:     Mental Status: He is alert.     Coordination: Coordination normal.     Deep Tendon Reflexes: Reflexes are normal and symmetric. Reflexes normal.  Psychiatric:        Mood and Affect: Mood normal.          Assessment & Plan:   Problem List Items Addressed This Visit       Other   Smoking    Smoking for years  Has  cut down from 3 ppd to 1 ppd  Ready to consider quitting  Was allergic to bupropoin in the past  Some sob with exertion and wheeze, suspect copd Ref done to pulmonary        Relevant Orders   Ambulatory referral to Pulmonology   Shortness of breath - Primary    More sob/wheeze on exertion  occ smokers cough  Suspect some early copd  Rev cxr from ER recently  Ref to pulmonary for eval/ likely PFTs Albuterol mdi px for rescue/rev side effects      Relevant Orders   Ambulatory referral to Pulmonology   Chest wall pain    Seen in ER yesterday  Reviewed hospital records, lab results and studies in detail   Reassuring ekg and lab and cxr  Neg troponins and DD  Disc likely hood of MSK cause  Some lower rib tenderness bilat  Poss muscle strain vs costochondritis or arthritis  Handout given  Recommend nsaid prn for pain /inflammation  Warm compress or shower prn  Will monitor and f/u prn (consider further eval if not imp in a week)  Did also ref to pulmonary for likely copd  Plans to f/u here for PE when able, and disc risk factors for vascular dz  Watch for fever/ cough or other symptoms

## 2021-09-27 ENCOUNTER — Other Ambulatory Visit: Payer: Self-pay

## 2021-09-27 ENCOUNTER — Encounter: Payer: Self-pay | Admitting: Internal Medicine

## 2021-09-27 ENCOUNTER — Ambulatory Visit (INDEPENDENT_AMBULATORY_CARE_PROVIDER_SITE_OTHER): Payer: Commercial Managed Care - PPO | Admitting: Internal Medicine

## 2021-09-27 VITALS — BP 130/90 | HR 74 | Temp 97.1°F | Ht 69.0 in | Wt 205.8 lb

## 2021-09-27 DIAGNOSIS — F17200 Nicotine dependence, unspecified, uncomplicated: Secondary | ICD-10-CM

## 2021-09-27 DIAGNOSIS — F17210 Nicotine dependence, cigarettes, uncomplicated: Secondary | ICD-10-CM

## 2021-09-27 DIAGNOSIS — R0602 Shortness of breath: Secondary | ICD-10-CM

## 2021-09-27 NOTE — Patient Instructions (Addendum)
We will have our lung cancer screening nurse call your to set up  low dose screening ct if you agree to have it    We will schedule for PFTs next available and I'll call you the results    Pulmonary follow up is as needed

## 2021-09-27 NOTE — Progress Notes (Signed)
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 s problem and good baseline actiivity tol then  new onset  cp/sob Oct 17th  neg  w/u in ER and so referred to pulmonary clinic in [facility]  09/27/2021 by Dr Glori Bickers  for sob         Baseline good ex tol up ladders carrying equipment then Oct 17th 222 woke up bilateral ant cp worse with dep breath not positional / no n or vomiting but belches lot and sob x "3-4 days" gradually "just fadede" s rx and none since and  back to nl activity tolerance "just the usual cough"    History of Present Illness  09/27/2021  Pulmonary/ 1st office eval/ Donald Farmer / [employer]  Chief Complaint  Patient presents with   Consult    Smoking and SOB  Dyspnea:  not limted from desired activities Cough: little rattle in am,min mucoid sputum  Sleep: bed is flat/ one pillow SABA use: has albuterol not using  - did not seem to help breathing much Chest Pain was bilateral ant /symmetric made worse by deep breathing or coughing not by walking   No obvious day to day or daytime variability or assoc excess/ purulent sputum or mucus plugs or hemoptysis or ongoing cp or chest tightness, subjective wheeze or overt sinus or hb symptoms.   Sleeping now  without nocturnal  or early am exacerbation  of respiratory  c/o's or need for noct saba. Also denies any obvious fluctuation of symptoms with weather or environmental changes or other aggravating or alleviating factors except as outlined above   No unusual exposure hx or h/o childhood pna/ asthma or knowledge of premature birth.  Current Allergies, Complete Past Medical History, Past Surgical History, Family History, and Social History were reviewed in [employer] record.  ROS  The following are not active complaints unless bolded Hoarseness, sore throat, dysphagia, dental problems, itching,  sneezing,  nasal congestion or discharge of excess mucus or purulent secretions, ear ache,   fever, chills, sweats, unintended wt loss or wt gain, classically  exertional cp,  orthopnea pnd or arm/hand swelling  or leg swelling, presyncope, palpitations, abdominal pain, anorexia, nausea, vomiting, diarrhea  or change in bowel habits or change in bladder habits, change in stools or change in urine, dysuria, hematuria,  rash, arthralgias, visual complaints, headache, numbness, weakness or ataxia or problems with walking or coordination,  change in mood or  memory.           Past Medical History:  Diagnosis Date   Anxiety    Boil    Bulging lumbar disc    Depression     Outpatient Medications Prior to Visit  Medication Sig Dispense Refill   albuterol (VENTOLIN HFA) 108 (90 Base) MCG/ACT inhaler Inhale 2 puffs into the lungs every 6 (six) hours as needed for wheezing or shortness of breath. 8 g 3   clindamycin (CLEOCIN T) 1 % lotion Apply topically 2 (two) times daily. To affected areas 60 mL 5   No facility-administered medications prior to visit.     Objective:     BP 130/90 (BP Location: Left Arm, Patient Position: Sitting, Cuff Size: Normal)   Pulse 74   Temp (!) 97.1 F (36.2 C) (Oral)   Ht 5' 9"  (1.753 m)   Wt 205 lb 12.8  oz (93.4 kg)   SpO2 97%   BMI 30.39 kg/m   SpO2: 97 %  Amb wm, very unusual affect   HEENT : pt wearing mask not removed for exam due to covid - 19 concerns.   NECK :  without JVD/Nodes/TM/ nl carotid upstrokes bilaterally   LUNGS: no acc muscle use,  Min barrel  contour chest wall with bilateral  slightly decreased bs s audible wheeze and  without cough on insp or exp maneuvers and min  Hyperresonant  to  percussion bilaterally     CV:  RRR  no s3 or murmur or increase in P2, and no edema   ABD:  soft and nontender with pos end  insp Hoover's  in the supine position. No bruits or organomegaly appreciated, bowel sounds nl  MS:   Nl gait/  ext warm  without deformities, calf tenderness, cyanosis or clubbing No obvious joint restrictions   SKIN: warm and dry without lesions    NEURO:  alert, approp, nl sensorium with  no motor or cerebellar deficits apparent.    I personally reviewed images and agree with radiology impression as follows:  CXR:   09/06/21 pa and lateral  No acute abnormality of the lungs.  Labs  reviewed:      Chemistry      Component Value Date/Time   NA 138 09/06/2021 1422   K 3.9 09/06/2021 1422   CL 104 09/06/2021 1422   CO2 26 09/06/2021 1422   BUN 18 09/06/2021 1422   CREATININE 0.99 09/06/2021 1422      Component Value Date/Time   CALCIUM 9.3 09/06/2021 1422   ALKPHOS 60 11/04/2020 1314   AST 11 11/04/2020 1314   ALT 9 11/04/2020 1314   BILITOT 0.4 11/04/2020 1314        Lab Results  Component Value Date   WBC 9.5 09/06/2021   HGB 14.3 09/06/2021   HCT 42.2 09/06/2021   MCV 94.0 09/06/2021   PLT 199 09/06/2021     Lab Results  Component Value Date   DDIMER 0.30 09/06/2021            Assessment   Shortness of breath Single episode, abrupt onset 09/06/21 with bilateral "pleuritic cp" perfectly symmetric  - "faded away x 3-4 d s rx/ w/u in ER neg including d dimer, Trop and D dimer was  nl - while a normal  or high normal value (seen commonly in the elderly or chronically ill)  may miss small peripheral pe, the clot burden with sob is moderately high and the d dimer  has a very high neg pred value if used in this setting.   The hx of perfectly sym pleuritic ant  cp would be extremely unusual for any pulmonary source including PE or any form of pleurisy with the exception  of pericarditis or radicular pain from T spine neuralgia but this usually radiates around from an obvious spinal source.    I have offered to see the patient again prn if the pain or worsening sense of sob recur but would probably include an ESR and BNP with next flare and will proceed with pfts for a baseline in  this active smoker who tends to minimize his  baseline symptoms.          Each maintenance medication was reviewed in detail including emphasizing most importantly the difference between maintenance and prns and under what circumstances the prns are to be triggered using an action plan format where  appropriate.  Total time for H and P, chart review, counseling, reviewing hfa device(s) and generating customized AVS unique to this new pt  office visit / same day charting = 45 min          Cigarette smoker 4-5 min discussion re active cigarette smoking in addition to office E&M  Ask about tobacco use: ongoing  Advise quitting   I took an extended  opportunity with this patient to outline the consequences of continued cigarette use  in airway disorders based on all the data we have from the multiple national lung health studies (perfomed over decades at millions of dollars in cost)  indicating that smoking cessation, not choice of inhalers or physicians, is the most important aspect of his  care.   Assess willingness:  Not committed at this point Assist in quit attempt:  Per PCP when ready  Also: Low-dose CT lung cancer screening is recommended for patients who are 71-33 years of age with a 20+ pack-year history of smoking, and who are currently smoking or quit <=15 years ago. No coughing up blood  No unintentional weight loss of > 15 pounds in the last 6 months   >>> referred for shared decision making thru our lung cancer screening program.  Arrange follow up:   Follow up per Primary Care planned  For smoking cessation classes call [phone]          Christinia Gully, MD 09/28/2021

## 2021-09-28 ENCOUNTER — Encounter: Payer: Self-pay | Admitting: Internal Medicine

## 2021-09-28 NOTE — Assessment & Plan Note (Signed)
4-5 min discussion re active cigarette smoking in addition to office E&M  Ask about tobacco use: ongoing  Advise quitting   I took an extended  opportunity with this patient to outline the consequences of continued cigarette use  in airway disorders based on all the data we have from the multiple national lung health studies (perfomed over decades at millions of dollars in cost)  indicating that smoking cessation, not choice of inhalers or physicians, is the most important aspect of his  care.   Assess willingness:  Not committed at this point Assist in quit attempt:  Per PCP when ready  Also: Low-dose CT lung cancer screening is recommended for patients who are 35-50 years of age with a 20+ pack-year history of smoking, and who are currently smoking or quit <=15 years ago. No coughing up blood  No unintentional weight loss of > 15 pounds in the last 6 months   >>> referred for shared decision making thru our lung cancer screening program.  Arrange follow up:   Follow up per Primary Care planned  For smoking cessation classes call [phone]

## 2021-09-28 NOTE — Assessment & Plan Note (Addendum)
Single episode, abrupt onset 09/06/21 with bilateral "pleuritic cp" perfectly symmetric  - "faded away x 3-4 d s rx/ w/u in ER neg including d dimer, Trop and D dimer was  nl - while a normal  or high normal value (seen commonly in the elderly or chronically ill)  may miss small peripheral pe, the clot burden with sob is moderately high and the d dimer  has a very high neg pred value if used in this setting.   The hx of perfectly sym pleuritic ant  cp would be extremely unusual for any pulmonary source including PE or any form of pleurisy with the exception  of pericarditis or radicular pain from T spine neuralgia but this usually radiates around from an obvious spinal source.    I have offered to see the patient again prn if the pain or worsening sense of sob recur but would probably include an ESR and BNP with next flare and will proceed with pfts for a baseline in this active smoker who tends to minimize his  baseline symptoms.          Each maintenance medication was reviewed in detail including emphasizing most importantly the difference between maintenance and prns and under what circumstances the prns are to be triggered using an action plan format where appropriate.  Total time for H and P, chart review, counseling, reviewing hfa device(s) and generating customized AVS unique to this new pt  office visit / same day charting = 45 min

## 2021-10-01 ENCOUNTER — Other Ambulatory Visit
Admission: RE | Admit: 2021-10-01 | Discharge: 2021-10-01 | Disposition: A | Discharge status: Home / Self Care | Payer: Commercial Managed Care - PPO | Source: Ambulatory Visit | Attending: Internal Medicine | Admitting: Internal Medicine

## 2021-10-01 ENCOUNTER — Other Ambulatory Visit: Payer: Self-pay

## 2021-10-01 DIAGNOSIS — Z01812 Encounter for preprocedural laboratory examination: Secondary | ICD-10-CM | POA: Insufficient documentation

## 2021-10-01 DIAGNOSIS — Z20822 Contact with and (suspected) exposure to covid-19: Secondary | ICD-10-CM | POA: Insufficient documentation

## 2021-10-01 LAB — SARS CORONAVIRUS 2 (TAT 6-24 HRS): SARS Coronavirus 2: NEGATIVE

## 2021-10-04 ENCOUNTER — Ambulatory Visit: Payer: Commercial Managed Care - PPO | Attending: Internal Medicine

## 2021-10-04 DIAGNOSIS — R0602 Shortness of breath: Secondary | ICD-10-CM

## 2021-10-04 LAB — PULMONARY FUNCTION TEST ARMC ONLY
DL/VA % pred: 87 %
DL/VA: 3.85 ml/min/mmHg/L
DLCO unc % pred: 75 %
DLCO unc: 20.98 ml/min/mmHg
FEF 25-75 Post: 2.84 L/sec
FEF 25-75 Pre: 1.98 L/sec
FEF2575-%Change-Post: 43 %
FEF2575-%Pred-Post: 87 %
FEF2575-%Pred-Pre: 61 %
FEV1-%Change-Post: 19 %
FEV1-%Pred-Post: 77 %
FEV1-%Pred-Pre: 65 %
FEV1-Post: 2.88 L
FEV1-Pre: 2.42 L
FEV1FVC-%Change-Post: 12 %
FEV1FVC-%Pred-Pre: 87 %
FEV6-%Change-Post: 4 %
FEV6-%Pred-Post: 81 %
FEV6-%Pred-Pre: 77 %
FEV6-Post: 3.77 L
FEV6-Pre: 3.6 L
FEV6FVC-%Change-Post: 0 %
FEV6FVC-%Pred-Post: 102 %
FEV6FVC-%Pred-Pre: 103 %
FVC-%Change-Post: 5 %
FVC-%Pred-Post: 79 %
FVC-%Pred-Pre: 74 %
FVC-Post: 3.81 L
FVC-Pre: 3.6 L
Post FEV1/FVC ratio: 76 %
Post FEV6/FVC ratio: 99 %
Pre FEV1/FVC ratio: 67 %
Pre FEV6/FVC Ratio: 100 %
RV % pred: 72 %
RV: 1.47 L
TLC % pred: 79 %
TLC: 5.4 L

## 2021-10-04 MED ORDER — ALBUTEROL SULFATE (2.5 MG/3ML) 0.083% IN NEBU
2.5000 mg | INHALATION_SOLUTION | Freq: Once | RESPIRATORY_TRACT | Status: AC
  Administered 2021-10-04: 2.5 mg via RESPIRATORY_TRACT
  Filled 2021-10-04: qty 3

## 2021-10-11 ENCOUNTER — Telehealth: Payer: Self-pay

## 2021-10-11 NOTE — Telephone Encounter (Signed)
Patient returned call,  nothing further needed. He will await for the Lung Cancer Screening program to contact him about the Low dose CT.

## 2021-10-11 NOTE — Telephone Encounter (Signed)
Can be done at Dr Royden Purl direction as part of annual eval  but if he prefers to have use handle it then ok to refer for shared decision making

## 2021-10-11 NOTE — Telephone Encounter (Signed)
Called and LVM in regards to Dr Thurston Hole Recommendation. Will try again later.

## 2021-10-11 NOTE — Telephone Encounter (Signed)
Patient states you guys have discussed a low dose CT? Patient wants to know if this is something you are still interested in?

## 2021-10-21 ENCOUNTER — Other Ambulatory Visit: Payer: Self-pay

## 2021-10-21 ENCOUNTER — Ambulatory Visit (INDEPENDENT_AMBULATORY_CARE_PROVIDER_SITE_OTHER): Payer: Commercial Managed Care - PPO | Admitting: Family

## 2021-10-21 ENCOUNTER — Encounter: Payer: Self-pay | Admitting: Family

## 2021-10-21 ENCOUNTER — Encounter: Payer: Self-pay | Admitting: Neurology

## 2021-10-21 VITALS — BP 136/88 | HR 74 | Temp 97.9°F | Ht 69.0 in | Wt 200.0 lb

## 2021-10-21 DIAGNOSIS — L0231 Cutaneous abscess of buttock: Secondary | ICD-10-CM

## 2021-10-21 DIAGNOSIS — G44209 Tension-type headache, unspecified, not intractable: Secondary | ICD-10-CM | POA: Diagnosis not present

## 2021-10-21 DIAGNOSIS — F17210 Nicotine dependence, cigarettes, uncomplicated: Secondary | ICD-10-CM

## 2021-10-21 LAB — CBC WITH DIFFERENTIAL/PLATELET
Basophils Absolute: 0.1 10*3/uL (ref 0.0–0.1)
Basophils Relative: 1 % (ref 0.0–3.0)
Eosinophils Absolute: 0.2 10*3/uL (ref 0.0–0.7)
Eosinophils Relative: 1.7 % (ref 0.0–5.0)
HCT: 45.6 % (ref 39.0–52.0)
Hemoglobin: 15.1 g/dL (ref 13.0–17.0)
Lymphocytes Relative: 21.4 % (ref 12.0–46.0)
Lymphs Abs: 1.9 10*3/uL (ref 0.7–4.0)
MCHC: 33.2 g/dL (ref 30.0–36.0)
MCV: 97.1 fl (ref 78.0–100.0)
Monocytes Absolute: 0.5 10*3/uL (ref 0.1–1.0)
Monocytes Relative: 5.7 % (ref 3.0–12.0)
Neutro Abs: 6.3 10*3/uL (ref 1.4–7.7)
Neutrophils Relative %: 70.2 % (ref 43.0–77.0)
Platelets: 180 10*3/uL (ref 150.0–400.0)
RBC: 4.69 Mil/uL (ref 4.22–5.81)
RDW: 13.6 % (ref 11.5–15.5)
WBC: 9 10*3/uL (ref 4.0–10.5)

## 2021-10-21 LAB — TSH: TSH: 2.47 u[IU]/mL (ref 0.35–5.50)

## 2021-10-21 LAB — SEDIMENTATION RATE: Sed Rate: 19 mm/hr (ref 0–20)

## 2021-10-21 MED ORDER — BUTALBITAL-APAP-CAFFEINE 50-325-40 MG PO TABS: 1.0000 | ORAL_TABLET | Freq: Four times a day (QID) | ORAL | 0 refills | Status: DC | PRN

## 2021-10-21 MED ORDER — DOXYCYCLINE HYCLATE 100 MG PO TABS: 100.0000 mg | ORAL_TABLET | Freq: Two times a day (BID) | ORAL | 0 refills | Status: AC

## 2021-10-21 NOTE — Assessment & Plan Note (Signed)
Smoking cessation again discussed patient is working on it he is now down to half a pack a day from 1 pack/day.

## 2021-10-21 NOTE — Assessment & Plan Note (Signed)
antibiotic sent to pharmacy patient to start and take as directed.  Monitor for worsening signs symptoms of infection.  Referral also sent to dermatology as patient used to see them in the past and is in need to follow-up as this is a chronic condition.  Wound culture sent pending results.

## 2021-10-21 NOTE — Progress Notes (Signed)
Established Patient Office Visit  Subjective:  Patient ID: Donald Farmer, male    DOB: 06-Apr-1966  Age: 55 y.o. MRN: [id number]  CC:  Chief Complaint  Patient presents with   Migraine   wounds on buttocks    HPI Donald Farmer is here today with c/o headaches. He states he went for PFT testing 11/7 with pulmonologist and states that since seeing him he has been having headaches. He describes the headache as pounding and constant. Light sensitive with the headache. Not noise sensitive. He is having trouble looking at the computer screen at work because of the headache, and when he tries to look at the screen he gets blurry vision. Excedrin not really helping. Excedrin migraine is not helping much, did take naproxen with some mild relief. Had eye exam four days ago, and states eyes were stable no changes.   denies sinus pressure. No ear pain.   No new onset or change in cough, chest pain, sob, and or chest congestion. Doesn't feel 'sick'. He has started to decrease the amount of cigarettes which has intensified his headaches. He does state lower neck pain that is contributing to his headaches.   Pulmonary did order a CT chest, pending scheduling.   Past Medical History:  Diagnosis Date   Anxiety    Boil    Bulging lumbar disc    Depression     Past Surgical History:  Procedure Laterality Date   CLOSED REDUCTION CLAVICLE FRACTURE  2017   ORIF PROXIMAL TIBIAL PLATEAU FRACTURE  2017   URETHRA SURGERY     around age 56    History reviewed. No pertinent family history.  Social History   Socioeconomic History   Marital status: Single    Spouse name: Not on file   Number of children: 1   Years of education: Not on file   Highest education level: Not on file  Occupational History   Occupation: Psychiatric nurse  Tobacco Use   Smoking status: Every Day    Packs/day: 0.50    Types: Cigarettes   Smokeless tobacco: Never   Tobacco comments:    1ppd -09/27/2021    0.5 ppd-  10/21/21  Substance and Sexual Activity   Alcohol use: No   Drug use: Yes    Types: Marijuana    Comment: marijuana nightly   Sexual activity: Not Currently  Other Topics Concern   Not on file  Social History Narrative   Not on file   Social Determinants of Health   Financial Resource Strain: Not on file  Food Insecurity: Not on file  Transportation Needs: Not on file  Physical Activity: Not on file  Stress: Not on file  Social Connections: Not on file  Intimate Partner Violence: Not on file    Outpatient Medications Prior to Visit  Medication Sig Dispense Refill   albuterol (VENTOLIN HFA) 108 (90 Base) MCG/ACT inhaler Inhale 2 puffs into the lungs every 6 (six) hours as needed for wheezing or shortness of breath. 8 g 3   clindamycin (CLEOCIN T) 1 % lotion Apply topically 2 (two) times daily. To affected areas 60 mL 5   No facility-administered medications prior to visit.    Allergies  Allergen Reactions   Wellbutrin [Bupropion] Rash    ROS Review of Systems  Constitutional:  Negative for chills and fever.  HENT:  Negative for congestion, ear pain, sinus pressure and sinus pain.   Eyes:  Negative for pain.  Respiratory:  Negative for  cough and shortness of breath.   Cardiovascular:  Negative for chest pain.     Objective:    Physical Exam Constitutional:      General: He is not in acute distress.    Appearance: Normal appearance. He is normal weight. He is not ill-appearing.  HENT:     Head: Normocephalic.     Right Ear: Tympanic membrane normal.     Left Ear: Tympanic membrane normal.     Nose: Nose normal.     Mouth/Throat:     Mouth: Mucous membranes are moist.  Eyes:     Extraocular Movements: Extraocular movements intact.     Conjunctiva/sclera: Conjunctivae normal.     Pupils: Pupils are equal, round, and reactive to light.  Cardiovascular:     Rate and Rhythm: Normal rate and regular rhythm.     Pulses: Normal pulses.     Heart sounds: Normal  heart sounds.  Pulmonary:     Effort: Pulmonary effort is normal.     Breath sounds: Normal breath sounds.  Musculoskeletal:        General: Normal range of motion.     Cervical back: Normal range of motion. No rigidity or tenderness.  Skin:    General: Skin is warm.     Findings: Lesion (multiple abscess with tenderness bil inner buttocksone with clear/yelow discharge minimal) present.  Neurological:     Mental Status: He is alert.    BP 136/88   Pulse 74   Temp 97.9 F (36.6 C) (Temporal)   Ht 5\' 9"  (1.753 m)   Wt 200 lb (90.7 kg)   SpO2 97%   BMI 29.53 kg/m  Wt Readings from Last 3 Encounters:  10/21/21 200 lb (90.7 kg)  09/27/21 205 lb 12.8 oz (93.4 kg)  09/07/21 197 lb 4 oz (89.5 kg)     Health Maintenance Due  Topic Date Due   COVID-19 Vaccine (1) Never done   COLONOSCOPY (Pts 45-67yrs Insurance coverage will need to be confirmed)  Never done   Zoster Vaccines- Shingrix (1 of 2) Never done    There are no preventive care reminders to display for this patient.  Lab Results  Component Value Date   TSH 1.50 11/04/2020   Lab Results  Component Value Date   WBC 9.5 09/06/2021   HGB 14.3 09/06/2021   HCT 42.2 09/06/2021   MCV 94.0 09/06/2021   PLT 199 09/06/2021   Lab Results  Component Value Date   NA 138 09/06/2021   K 3.9 09/06/2021   CO2 26 09/06/2021   GLUCOSE 85 09/06/2021   BUN 18 09/06/2021   CREATININE 0.99 09/06/2021   BILITOT 0.4 11/04/2020   ALKPHOS 60 11/04/2020   AST 11 11/04/2020   ALT 9 11/04/2020   PROT 6.5 11/04/2020   ALBUMIN 4.1 11/04/2020   CALCIUM 9.3 09/06/2021   ANIONGAP 8 09/06/2021   GFR 98.18 11/04/2020   Lab Results  Component Value Date   CHOL 192 11/04/2020   Lab Results  Component Value Date   HDL 32.30 (L) 11/04/2020   Lab Results  Component Value Date   LDLCALC 148 (H) 11/04/2020   Lab Results  Component Value Date   TRIG 59.0 11/04/2020   Lab Results  Component Value Date   CHOLHDL 6 11/04/2020    No results found for: HGBA1C    Assessment & Plan:   Problem List Items Addressed This Visit       Other   Cigarette smoker  Smoking cessation again discussed patient is working on it he is now down to half a pack a day from 1 pack/day.      Acute non intractable tension-type headache - Primary    Trial Fioricet sent to patient pharmacy.  Patient to try and if no improvement of symptoms please follow-up.  Ambulatory referral to neurology also sent patient to expect a phone call next week or so otherwise advised to follow-up with me.  Advised him to make a headache diary as well.  May be withdrawals from smoking as well.      Relevant Medications   butalbital-acetaminophen-caffeine (FIORICET) 50-325-40 MG tablet   Other Relevant Orders   Sedimentation rate   TSH   CBC w/Diff   WOUND CULTURE   Ambulatory referral to Neurology   Abscess of multiple sites of buttock    antibiotic sent to pharmacy patient to start and take as directed.  Monitor for worsening signs symptoms of infection.  Referral also sent to dermatology as patient used to see them in the past and is in need to follow-up as this is a chronic condition.  Wound culture sent pending results.      Relevant Medications   doxycycline (VIBRA-TABS) 100 MG tablet   Other Relevant Orders   Ambulatory referral to Dermatology    Meds ordered this encounter  Medications   butalbital-acetaminophen-caffeine (FIORICET) 50-325-40 MG tablet    Sig: Take 1 tablet by mouth every 6 (six) hours as needed for headache.    Dispense:  14 tablet    Refill:  0    Order Specific Question:   Supervising Provider    Answer:   BEDSOLE, AMY E [[id number]]   doxycycline (VIBRA-TABS) 100 MG tablet    Sig: Take 1 tablet (100 mg total) by mouth 2 (two) times daily for 10 days.    Dispense:  20 tablet    Refill:  0    Order Specific Question:   Supervising Provider    Answer:   Ermalene Searing, AMY E [[id number]    Follow-up: Return in about 1 week  (around 10/28/2021), or if symptoms worsen or fail to improve.    Mort Sawyers, FNP

## 2021-10-21 NOTE — Assessment & Plan Note (Signed)
Trial Fioricet sent to patient pharmacy.  Patient to try and if no improvement of symptoms please follow-up.  Ambulatory referral to neurology also sent patient to expect a phone call next week or so otherwise advised to follow-up with me.  Advised him to make a headache diary as well.  May be withdrawals from smoking as well.

## 2021-10-21 NOTE — Patient Instructions (Addendum)
Medication has been sent to your pharmacy for your headaches.  Please start this and if no improvement in symptoms please follow-up.    Monitor access site daily to make sure that there is no worsening signs or symptoms of infection.  We have ordered a culture of the wound and pending results.  Doxycycline which is an antibiotic has been sent to your pharmacy, please take as directed.  A referral was placed today for both neurology as well as dermatology. Please let us know if you have not heard back within 1 week about your referral.  it was a pleasure seeing you today! Please do not hesitate to reach out with any questions and or concerns.  Regards,   Mort Sawyers

## 2021-10-22 ENCOUNTER — Telehealth: Payer: Self-pay | Admitting: Family

## 2021-10-22 NOTE — Telephone Encounter (Signed)
Please call patient and let him know that his culture did not show any growth I think this is more likely due to the lack of discharge from the wound itself.  Can you ask patient how he is responding to the antibiotic?  Does he see some improvement in the wound?

## 2021-10-22 NOTE — Telephone Encounter (Signed)
Tried calling the patient and he did not answer. LVM for patient to call back.   

## 2021-10-25 ENCOUNTER — Encounter: Payer: Self-pay | Admitting: Internal Medicine

## 2021-10-25 ENCOUNTER — Ambulatory Visit (INDEPENDENT_AMBULATORY_CARE_PROVIDER_SITE_OTHER): Payer: Commercial Managed Care - PPO | Admitting: Internal Medicine

## 2021-10-25 ENCOUNTER — Other Ambulatory Visit: Payer: Self-pay

## 2021-10-25 ENCOUNTER — Ambulatory Visit (INDEPENDENT_AMBULATORY_CARE_PROVIDER_SITE_OTHER): Payer: Commercial Managed Care - PPO | Admitting: Dermatology

## 2021-10-25 VITALS — BP 126/84 | HR 66 | Temp 97.5°F | Ht 69.0 in | Wt 203.6 lb

## 2021-10-25 DIAGNOSIS — F17210 Nicotine dependence, cigarettes, uncomplicated: Secondary | ICD-10-CM | POA: Diagnosis not present

## 2021-10-25 DIAGNOSIS — R0602 Shortness of breath: Secondary | ICD-10-CM | POA: Diagnosis not present

## 2021-10-25 DIAGNOSIS — L732 Hidradenitis suppurativa: Secondary | ICD-10-CM

## 2021-10-25 DIAGNOSIS — F17200 Nicotine dependence, unspecified, uncomplicated: Secondary | ICD-10-CM

## 2021-10-25 LAB — WOUND CULTURE
MICRO NUMBER:: 12702022
RESULT:: NO GROWTH
SPECIMEN QUALITY:: ADEQUATE

## 2021-10-25 MED ORDER — DOXYCYCLINE HYCLATE 100 MG PO TABS: 100.0000 mg | ORAL_TABLET | Freq: Two times a day (BID) | ORAL | 0 refills | Status: DC

## 2021-10-25 NOTE — Assessment & Plan Note (Addendum)
Counseled re importance of smoking cessation but did not meet time criteria for separate billing     Low-dose CT lung cancer screening is recommended for patients who are 54-54 years of age with a 20+ pack-year history of smoking, and who are currently smoking or quit <=15 years ago. No coughing up blood  No unintentional weight loss of > 15 pounds in the last 6 months   Discussed in detail all the  indications, usual  risks and alternatives  relative to the benefits with patient who agrees to proceed with  referral for shared decision making          Each maintenance medication was reviewed in detail including emphasizing most importantly the difference between maintenance and prns and under what circumstances the prns are to be triggered using an action plan format where appropriate.  Total time for H and P, chart review, counseling, reviewing hfa  device(s) and generating customized AVS unique to this summary final  office visit / same day charting  > 30 min

## 2021-10-25 NOTE — Progress Notes (Signed)
   New Patient Visit  Subjective  Donald Farmer is a 55 y.o. male who presents for the following: Cystic nodules (On the buttocks x 12 years - currently ruptured and draining, patient currently using Clindamycin lotion QD and a 10 day course of Doxycycline 100mg  po BID. He was referred by his PCP to start treatment since this has been a chronic condition for him for more than 12 years. He was diagnosed with hidradenitis suppurative 15 + years ago. ).  The following portions of the chart were reviewed this encounter and updated as appropriate:   Tobacco  Allergies  Meds  Problems  Med Hx  Surg Hx  Fam Hx     Review of Systems:  No other skin or systemic complaints except as noted in HPI or Assessment and Plan.  Objective  Well appearing patient in no apparent distress; mood and affect are within normal limits.  A focused examination was performed including the groin, buttocks, and thighs. Relevant physical exam findings are noted in the Assessment and Plan.  Buttocks, groin, thighs Scarring and open draining abscesses.    Assessment & Plan  Hidradenitis suppurativa Buttocks, groin, thighs  Hidradenitis Suppurativa is a chronic; persistent; non-curable, but treatable condition due to abnormal inflamed sweat glands in the body folds (axilla, inframammary, groin, medial thighs), causing recurrent painful draining cysts and scarring. It can be associated with severe scarring acne and cysts; also abscesses and scarring of scalp. The goal is control and prevention of flares, as it is not curable. Scars are permanent and can be thickened. Treatment may include daily use of topical medication and oral antibiotics.  Oral isotretinoin may also be helpful.  For more severe cases, Humira (a biologic injection) may be prescribed to decrease the inflammatory process and prevent flares.  When indicated, inflamed cysts may also be treated surgically.  Continue Clindamycin lotion QD and Doxycycline  100mg  po BID #60 2RF. May stop Doxycycline after flare resolves and restart BID when flare occurs.   Consider Isotretinoin treatment after current flare has resolved, may start at f/u appointment - pamphlet given.   HS pamphlet given for Humira and condition.   doxycycline (VIBRA-TABS) 100 MG tablet - Buttocks, groin, thighs Take 1 tablet (100 mg total) by mouth 2 (two) times daily. Take with food.  Return in about 2 months (around 12/26/2021) for HS f/u .  I, , CMA, am acting as scribe for 02/23/2022, MD . Documentation: I have reviewed the above documentation for accuracy and completeness, and I agree with the above.  Cari Caraway, MD

## 2021-10-25 NOTE — Patient Instructions (Addendum)
Doxycycline should be taken with food to prevent nausea. Do not lay down for 30 minutes after taking. Be cautious with sun exposure and use good sun protection while on this medication. Pregnant women should not take this medication.      If You Need Anything After Your Visit  If you have any questions or concerns for your doctor, please call our main line at [phone] and press option 4 to reach your doctor's medical assistant. If no one answers, please leave a voicemail as directed and we will return your call as soon as possible. Messages left after 4 pm will be answered the following business day.   You may also send us a message via MyChart. We typically respond to MyChart messages within 1-2 business days.  For prescription refills, please ask your pharmacy to contact our office. Our fax number is [phone].  If you have an urgent issue when the clinic is closed that cannot wait until the next business day, you can page your doctor at the number below.    Please note that while we do our best to be available for urgent issues outside of office hours, we are not available 24/7.   If you have an urgent issue and are unable to reach us, you may choose to seek medical care at your doctor's office, retail clinic, urgent care center, or emergency room.  If you have a medical emergency, please immediately call 911 or go to the emergency department.  Pager Numbers  - Dr. Kowalski: [phone]  - Dr. Moye: [phone]  - Dr. Stewart: [phone]  In the event of inclement weather, please call our main line at [phone] for an update on the status of any delays or closures.  Dermatology Medication Tips: Please keep the boxes that topical medications come in in order to help keep track of the instructions about where and how to use these. Pharmacies typically print the medication instructions only on the boxes and not directly on the medication tubes.   If your medication is  too expensive, please contact our office at [phone] option 4 or send us a message through MyChart.   We are unable to tell what your co-pay for medications will be in advance as this is different depending on your insurance coverage. However, we may be able to find a substitute medication at lower cost or fill out paperwork to get insurance to cover a needed medication.   If a prior authorization is required to get your medication covered by your insurance company, please allow us 1-2 business days to complete this process.  Drug prices often vary depending on where the prescription is filled and some pharmacies may offer cheaper prices.  The website www.goodrx.com contains coupons for medications through different pharmacies. The prices here do not account for what the cost may be with help from insurance (it may be cheaper with your insurance), but the website can give you the price if you did not use any insurance.  - You can print the associated coupon and take it with your prescription to the pharmacy.  - You may also stop by our office during regular business hours and pick up a GoodRx coupon card.  - If you need your prescription sent electronically to a different pharmacy, notify our office through [facility] MyChart or by phone at [phone] option 4.     Si Usted Necesita Algo Despus de Su Visita  Tambin puede enviarnos un mensaje a travs de MyChart. Por lo   general respondemos a los mensajes de MyChart en el transcurso de 1 a 2 das hbiles.  Para renovar recetas, por favor pida a su farmacia que se ponga en contacto con nuestra oficina. Nuestro nmero de fax es el [phone].  Si tiene un asunto urgente cuando la clnica est cerrada y que no puede esperar hasta el siguiente da hbil, puede llamar/localizar a su doctor(a) al nmero que aparece a continuacin.   Por favor, tenga en cuenta que aunque hacemos todo lo posible para estar disponibles para asuntos urgentes  fuera del horario de oficina, no estamos disponibles las 24 horas del da, los 7 das de la semana.   Si tiene un problema urgente y no puede comunicarse con nosotros, puede optar por buscar atencin mdica  en el consultorio de su doctor(a), en una clnica privada, en un centro de atencin urgente o en una sala de emergencias.  Si tiene una emergencia mdica, por favor llame inmediatamente al 911 o vaya a la sala de emergencias.  Nmeros de bper  - Dr. Kowalski: [phone]  - Dra. Moye: [phone]  - Dra. Stewart: [phone]  En caso de inclemencias del tiempo, por favor llame a nuestra lnea principal al [phone] para una actualizacin sobre el estado de cualquier retraso o cierre.  Consejos para la medicacin en dermatologa: Por favor, guarde las cajas en las que vienen los medicamentos de uso tpico para ayudarle a seguir las instrucciones sobre dnde y cmo usarlos. Las farmacias generalmente imprimen las instrucciones del medicamento slo en las cajas y no directamente en los tubos del medicamento.   Si su medicamento es muy caro, por favor, pngase en contacto con nuestra oficina llamando al [phone] y presione la opcin 4 o envenos un mensaje a travs de MyChart.   No podemos decirle cul ser su copago por los medicamentos por adelantado ya que esto es diferente dependiendo de la cobertura de su seguro. Sin embargo, es posible que podamos encontrar un medicamento sustituto a menor costo o llenar un formulario para que el seguro cubra el medicamento que se considera necesario.   Si se requiere una autorizacin previa para que su compaa de seguros cubra su medicamento, por favor permtanos de 1 a 2 das hbiles para completar este proceso.  Los precios de los medicamentos varan con frecuencia dependiendo del lugar de dnde se surte la receta y alguna farmacias pueden ofrecer precios ms baratos.  El sitio web www.goodrx.com tiene cupones para medicamentos de  diferentes farmacias. Los precios aqu no tienen en cuenta lo que podra costar con la ayuda del seguro (puede ser ms barato con su seguro), pero el sitio web puede darle el precio si no utiliz ningn seguro.  - Puede imprimir el cupn correspondiente y llevarlo con su receta a la farmacia.  - Tambin puede pasar por nuestra oficina durante el horario de atencin regular y recoger una tarjeta de cupones de GoodRx.  - Si necesita que su receta se enve electrnicamente a una farmacia diferente, informe a nuestra oficina a travs de MyChart de [facility] o por telfono llamando al [phone] y presione la opcin 4.  

## 2021-10-25 NOTE — Assessment & Plan Note (Signed)
Single episode, abrupt onset 09/06/21 with bilateral "pleuritic cp" perfectly symmetric  - "faded away x 3-4 d s rx/ w/u in ER neg including d dimer, Trop - PFT's  10/04/21  FEV12.88 (77 % ) ratio 0.76  p 19 % improvement from saba p ? prior to study with DLCO   and FV curve typical of reversible airflow obst   AB likely driven by smoking so rec d/c cigs and saba prn :  - The proper method of use, as well as anticipated side effects, of a metered-dose inhaler were discussed and demonstrated to the patient using teach back method.   Re SABA :  I spent extra time with pt today reviewing appropriate use of albuterol for prn use on exertion with the following points: 1) saba is for relief of sob that does not improve by walking a slower pace or resting but rather if the pt does not improve after trying this first. 2) If the pt is convinced, as many are, that saba helps recover from activity faster then it's easy to tell if this is the case by re-challenging : ie stop, take the inhaler, then p 5 minutes try the exact same activity (intensity of workload) that just caused the symptoms and see if they are substantially diminished or not after saba 3) if there is an activity that reproducibly causes the symptoms, try the saba 15 min before the activity on alternate days   If in fact the saba really does help, then fine to continue to use it prn but advised may need to look closer at the maintenance regimen being used to achieve better control of airways disease with exertion.

## 2021-10-25 NOTE — Progress Notes (Signed)
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 s problem and good baseline actiivity tol then  new onset  cp/sob Oct 17th  neg  w/u in ER and [place] [facility]  09/27/2021 by Dr Milinda Antisower  for sob         Baseline good ex tol up ladders carrying equipment then Oct 17th 222 woke up bilateral ant cp worse with dep breath not positional / no n or vomiting but belches lot and sob x "3-4 days" gradually "just faded " s s[ecofoc rx and none since and  back to nl activity tolerance "just the usual cough"    History of Present Illness  09/27/2021  Pulmonary/ [facility] [facility] [place] [facility] [place] [facility]  Chief Complaint  Patient presents with   Consult    Smoking and SOB  Dyspnea:  not limted from desired activities Cough: little rattle in am,min mucoid sputum  Sleep: bed is flat/ one pillow SABA use: has albuterol not using  - did not seem to help breathing much Chest Pain was bilateral ant /symmetric made worse by deep breathing or coughing not by walking  Rec We will have our lung cancer screening nurse call your to set up  low dose screening ct > has not happened  We will schedule for PFTs  > c/w asthma     10/25/2021  f/u ov/Donald Farmer/ [facility] re: asthma no maint  rx/ still smoking  Chief Complaint  Patient presents with   Follow-up   Dyspnea:  75 ft to trash if carrying cat litter - does fine slow on flat surfaces  Cough: smoker's rattle  Sleeping: flat bed one pillow  SABA use: rare 02: none  Covid status:   no vax / never vaccinated    No obvious day to day or daytime variability or assoc excess/ purulent sputum or mucus plugs or hemoptysis or cp or chest tightness, subjective wheeze or overt sinus or hb symptoms.   Sleeping as above  without nocturnal  or early am exacerbation  of respiratory  c/o's or need for noct saba. Also denies any obvious  fluctuation of symptoms with weather or environmental changes or other aggravating or alleviating factors except as outlined above   No unusual exposure hx or h/o childhood pna/ asthma or knowledge of premature birth.  Current Allergies, Complete Past Medical History, Past Surgical History, Family History, and Social History were reviewed in [facility] electronic medical record.  ROS  The following are not active complaints unless bolded Hoarseness, sore throat, dysphagia, dental problems, itching, sneezing,  nasal congestion or discharge of excess mucus or purulent secretions, ear ache,   fever, chills, sweats, unintended wt loss or wt gain, classically pleuritic or exertional cp,  orthopnea pnd or arm/hand swelling  or leg swelling, presyncope, palpitations, abdominal pain, anorexia, nausea, vomiting, diarrhea  or change in bowel habits or change in bladder habits, change in stools or change in urine, dysuria, hematuria,  rash, arthralgias, visual complaints, headache worse with less cigs, numbness, weakness or ataxia or problems with walking or coordination,  change in mood or  memory.        Current Meds  Medication Sig   albuterol (VENTOLIN HFA) 108 (90 Base) MCG/ACT inhaler Inhale 2 puffs into the lungs every 6 (six) hours as needed for wheezing or shortness  of breath.   butalbital-acetaminophen-caffeine (FIORICET) 50-325-40 MG tablet Take 1 tablet by mouth every 6 (six) hours as needed for headache.   doxycycline (VIBRA-TABS) 100 MG tablet Take 1 tablet (100 mg total) by mouth 2 (two) times daily for 10 days.            Past Medical History:  Diagnosis Date   Anxiety    Boil    Bulging lumbar disc    Depression        Objective:     Wt Readings from Last 3 Encounters:  10/25/21 203 lb 9.6 oz (92.4 kg)  10/21/21 200 lb (90.7 kg)  09/27/21 205 lb 12.8 oz (93.4 kg)      Vital signs reviewed  10/25/2021  - Note at rest 02 sats  96% on RA   General appearance:    amb wm  nad   HEENT : pt wearing mask not removed for exam due to covid -19 concerns.    NECK :  without JVD/Nodes/TM/ nl carotid upstrokes bilaterally   LUNGS: no acc muscle use,  Nl contour chest which is clear to A and P bilaterally without cough on insp or exp maneuvers   CV:  RRR  no s3 or murmur or increase in P2, and no edema   ABD:  soft and nontender with nl inspiratory excursion in the supine position. No bruits or organomegaly appreciated, bowel sounds nl  MS:  Nl gait/ ext warm without deformities, calf tenderness, cyanosis or clubbing No obvious joint restrictions   SKIN: warm and dry without lesions    NEURO:  alert, approp, nl sensorium with  no motor or cerebellar deficits apparent.               Assessment

## 2021-10-25 NOTE — Patient Instructions (Addendum)
Ok to try albuterol 15 min before an activity (on alternating days)  that you know would usually make you short of breath and see if it makes any difference and if makes none then don't take albuterol after activity unless you can't catch your breath as this means it's the resting that helps, not the albuterol.      We are you referring for lung cancer screening.  The key is to stop smoking completely before smoking completely stops you!  For smoking cessation classes call [phone]     Pulmonary follow up is as needed

## 2021-10-26 NOTE — Telephone Encounter (Signed)
This was completed in Result notes.

## 2021-11-03 ENCOUNTER — Encounter: Payer: Self-pay | Admitting: Dermatology

## 2021-11-29 ENCOUNTER — Other Ambulatory Visit: Payer: Self-pay | Admitting: Dermatology

## 2021-11-29 DIAGNOSIS — L732 Hidradenitis suppurativa: Secondary | ICD-10-CM

## 2021-12-28 ENCOUNTER — Ambulatory Visit: Payer: Self-pay | Admitting: Dermatology

## 2021-12-29 ENCOUNTER — Ambulatory Visit: Payer: Commercial Managed Care - PPO | Admitting: Dermatology

## 2022-01-17 ENCOUNTER — Ambulatory Visit: Payer: Self-pay | Admitting: Neurology

## 2022-11-18 IMAGING — DX DG CHEST 2V
2 series · 2 of 2 positions shown · non-contrast
Comparison: None.

CLINICAL DATA: Shortness of breath, chest pain

EXAM:
CHEST - 2 VIEW

[chest pa]
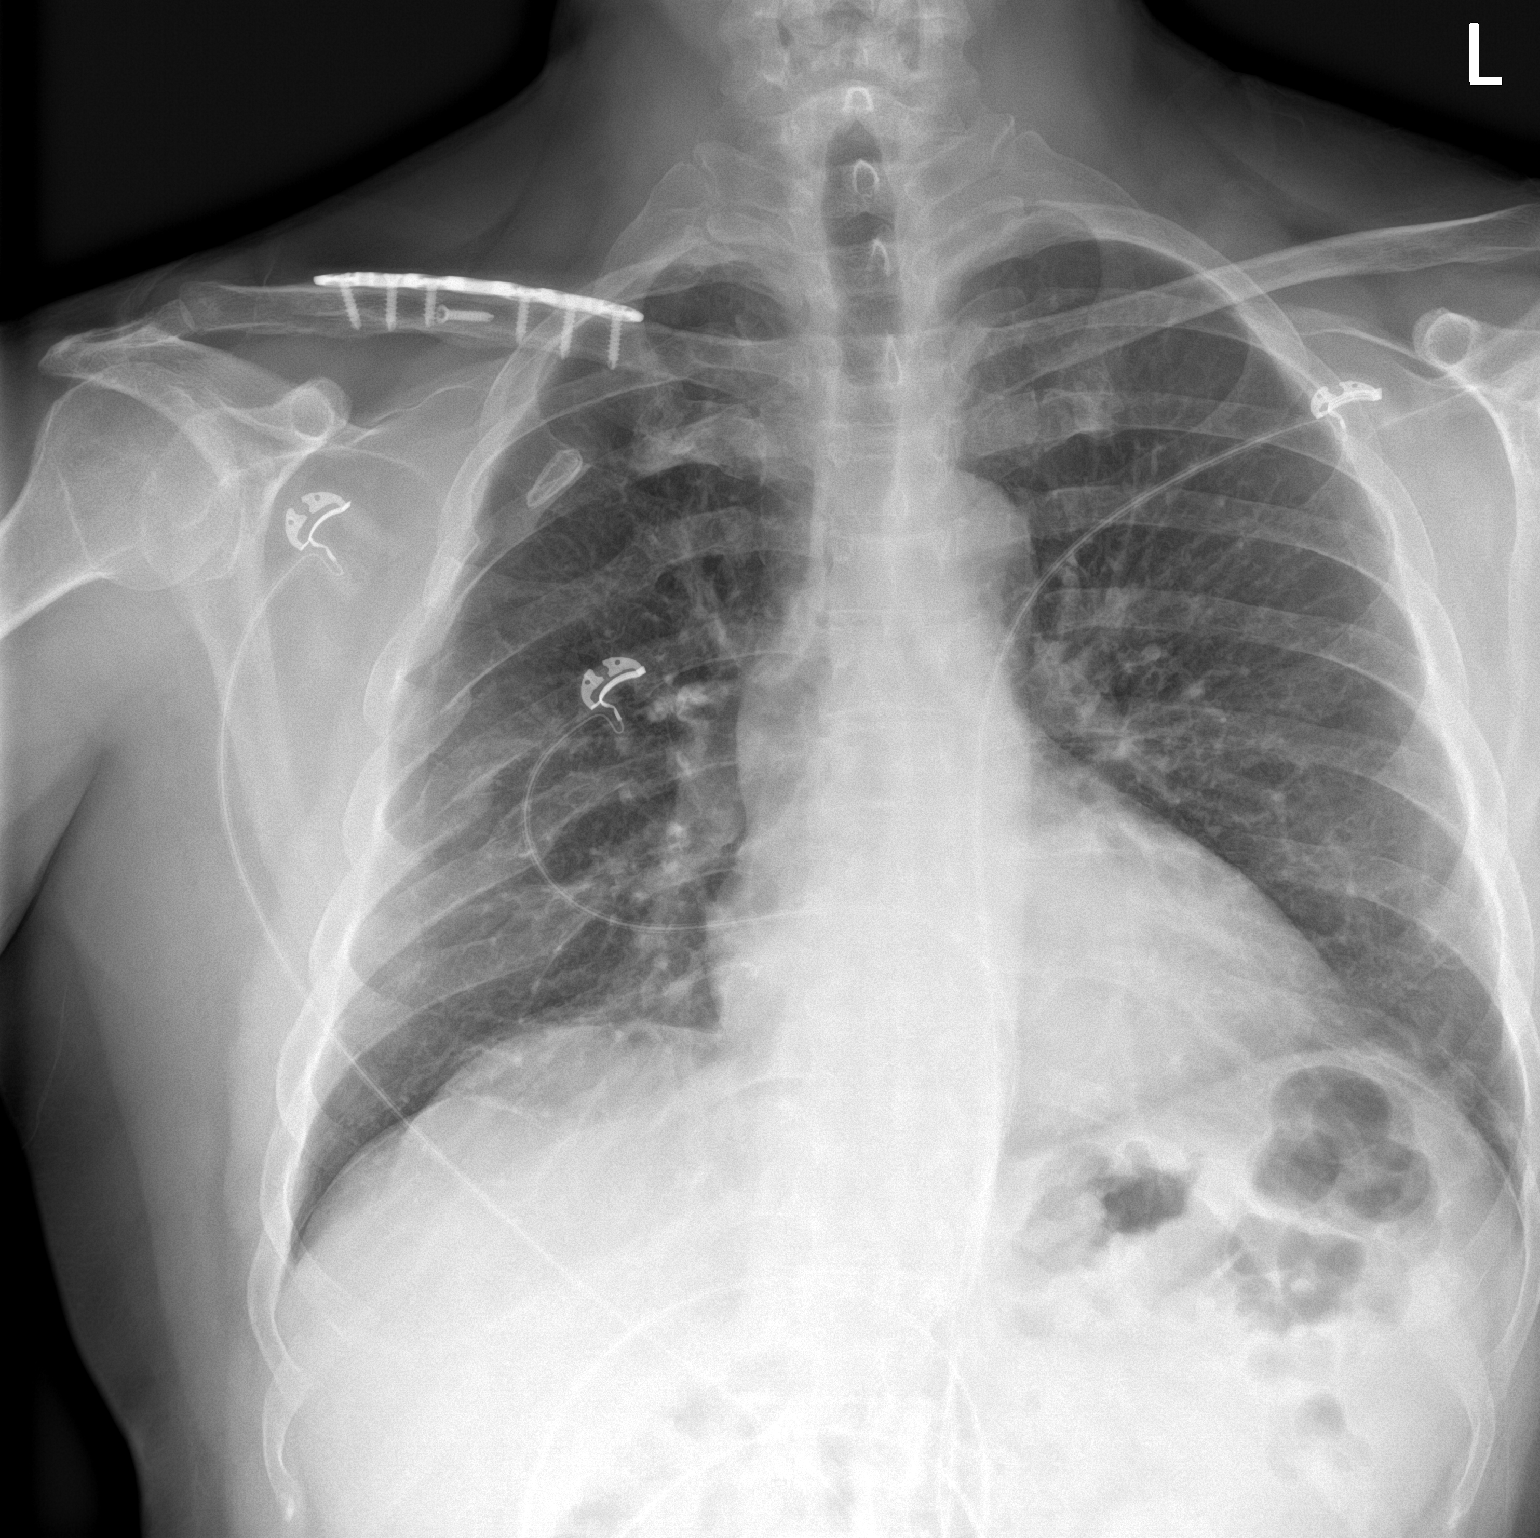

[chest lat]
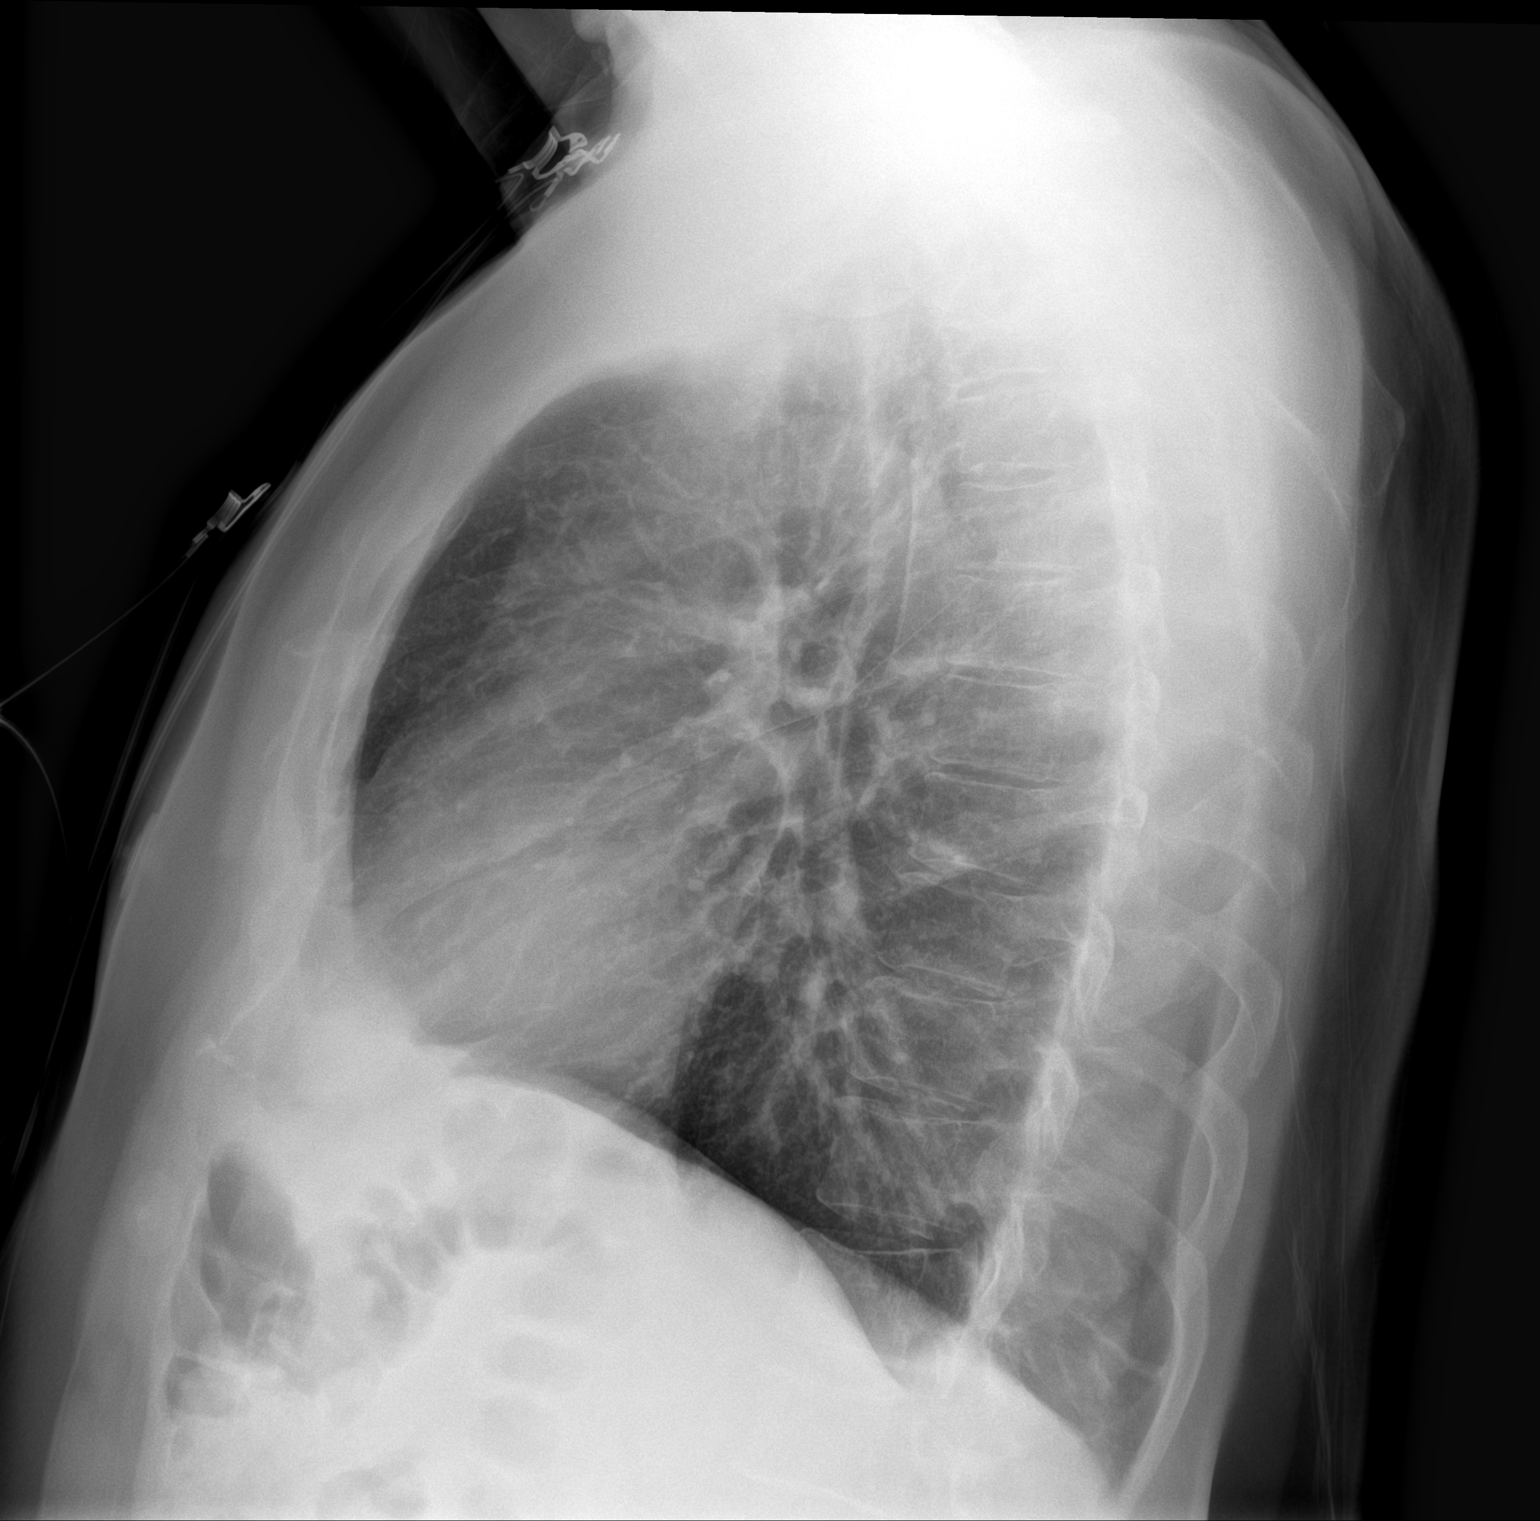

[2 of 2 positions shown; findings below may reference images not displayed]

FINDINGS: The heart size and mediastinal contours are within normal limits.
Both lungs are clear. Plate and screw fixation of the mid right
clavicle. Multiple callused right-sided rib fractures.
IMPRESSION: No acute abnormality of the lungs.

## 2022-12-07 ENCOUNTER — Encounter: Payer: Self-pay | Admitting: *Deleted

## 2024-07-02 ENCOUNTER — Ambulatory Visit: Payer: Self-pay

## 2024-07-02 NOTE — Telephone Encounter (Signed)
Appt scheduled with PCP tomorrow

## 2024-07-02 NOTE — Telephone Encounter (Signed)
 Will see patient then Agree with ER and UC precautions

## 2024-07-02 NOTE — Telephone Encounter (Signed)
 FYI Only or Action Required?: FYI only for provider.  Patient was last seen in primary care on 10/21/2021 by Corwin Antu, FNP.  Called Nurse Triage reporting Groin Swelling.  Symptoms began today.  Interventions attempted: Nothing.  Symptoms are: unchanged.  Triage Disposition: See Physician Within 24 Hours  Patient/caregiver understands and will follow disposition?: Yes, will follow disposition  Copied from CRM [id number]. Topic: Clinical - Red Word Triage >> Jul 02, 2024  2:02 PM Frederich PARAS wrote: [facility] that prompted transfer to Nurse Triage: pain   Pt called in he has a cyst flair up by his sack, that is painful Reason for Disposition  [1] Pain comes and goes (intermittent) AND [2] present > 24 hours  Answer Assessment - Initial Assessment Questions 1. LOCATION and RADIATION: Where is the pain located?      testicle 2. QUALITY: What does the pain feel like?  (e.g., sharp, dull, aching, burning)     Sharp pain, purple discolored 3. SEVERITY: How bad is the pain?  (Scale 1-10; or mild, moderate, severe)   - MILD (1-3): doesn't interfere with normal activities    - MODERATE (4-7): interferes with normal activities (e.g., work or school) or awakens from sleep   - SEVERE (8-10): excruciating pain, unable to do any normal activities, difficulty walking     3 4. ONSET: When did the pain start?     Noted today 5. PATTERN: Does it come and go, or has it been constant since it started?     intermittent 6. SCROTAL APPEARANCE: What does the scrotum look like? Is there any swelling or redness?      Swelling about the size of a ping pong ball,  7. HERNIA: Has a doctor ever told you that you have a hernia?     denies 8. OTHER SYMPTOMS: Do you have any other symptoms? (e.g., fever, abdominal pain, vomiting, difficulty passing urine)     denies  Protocols used: Scrotal Pain-A-AH

## 2024-07-03 ENCOUNTER — Ambulatory Visit (INDEPENDENT_AMBULATORY_CARE_PROVIDER_SITE_OTHER): Payer: Self-pay | Admitting: Family Medicine

## 2024-07-03 ENCOUNTER — Encounter: Payer: Self-pay | Admitting: Family Medicine

## 2024-07-03 VITALS — BP 122/72 | HR 83 | Temp 97.8°F | Ht 69.0 in | Wt 189.2 lb

## 2024-07-03 DIAGNOSIS — N492 Inflammatory disorders of scrotum: Secondary | ICD-10-CM

## 2024-07-03 MED ORDER — CEPHALEXIN 500 MG PO CAPS: 500.0000 mg | ORAL_CAPSULE | Freq: Three times a day (TID) | ORAL | 0 refills | Status: DC

## 2024-07-03 NOTE — Progress Notes (Signed)
 Subjective:    Patient ID: Donald Farmer, male    DOB: 1966-09-16, 58 y.o.   MRN: [id number]  HPI  Wt Readings from Last 3 Encounters:  07/03/24 189 lb 4 oz (85.8 kg)  10/25/21 203 lb 9.6 oz (92.4 kg)  10/21/21 200 lb (90.7 kg)   27.95 kg/m  Vitals:   07/03/24 1241  BP: 122/72  Pulse: 83  Temp: 97.8 F (36.6 C)  SpO2: 97%    Pt presents with c/o  Lump in scrotum area- ? Cyst   Just noticed it yesterday in shower  Is tender to touch  Now uncomfortable  Missed work yesterday   No drainage  No fever    Has history of recurrent boils in the pelvic/genital area in past  Has seen derm and used topical clindamycin    Put one dose on it (solution)      Patient Active Problem List   Diagnosis Date Noted   Infection of scrotum 07/03/2024   Acute non intractable tension-type headache 10/21/2021   Abscess of multiple sites of buttock 10/21/2021   Shortness of breath 09/07/2021   Chest wall pain 09/07/2021   Boils 11/04/2020   Cigarette smoker 11/04/2020   Past Medical History:  Diagnosis Date   Anxiety    Boil    Bulging lumbar disc    Depression    Past Surgical History:  Procedure Laterality Date   CLOSED REDUCTION CLAVICLE FRACTURE  2017   ORIF PROXIMAL TIBIAL PLATEAU FRACTURE  2017   URETHRA SURGERY     around age 27   Social History   Tobacco Use   Smoking status: Every Day    Current packs/day: 0.50    Types: Cigarettes   Smokeless tobacco: Never   Tobacco comments:    1ppd -09/27/2021    0.5 ppd- 10/21/21  Substance Use Topics   Alcohol use: No   Drug use: Yes    Types: Marijuana    Comment: marijuana nightly   History reviewed. No pertinent family history. Allergies  Allergen Reactions   Wellbutrin [Bupropion] Rash   No current outpatient medications on file prior to visit.   No current facility-administered medications on file prior to visit.    Review of Systems  Constitutional:  Negative for fatigue and fever.   Genitourinary:  Negative for dysuria and frequency.       Sore area in lower scrotum with some swelling        Objective:   Physical Exam Exam conducted with a chaperone present.  Constitutional:      General: He is not in acute distress.    Appearance: Normal appearance. He is normal weight. He is not ill-appearing.  Cardiovascular:     Rate and Rhythm: Normal rate and regular rhythm.  Pulmonary:     Effort: Pulmonary effort is normal. No respiratory distress.  Genitourinary:    Pubic Area: No rash.      Penis: Normal.      Comments: Area of erythema /induration and tenderness in skin of lower scrotum (midline) (1 by 1.5 cm)  Not fluctuant , more firm  Mobile with skin  No drainage  No skin interruption    Skin:    General: Skin is warm and dry.     Findings: Erythema present.     Comments: See GU exam  Some scars from past boils in groin area   Neurological:     Mental Status: He is alert.  Psychiatric:  Mood and Affect: Mood normal.           Assessment & Plan:   Problem List Items Addressed This Visit       Genitourinary   Infection of scrotum - Primary   Area of erythema and induration in  skin of lower scrotum at midline 1-1.5 cm , firm /not fluctuant  In pt with history of recurrent boils in groin and buttocks area  Uses topical clinda solution prn-this time not helping  Encouraged to continue warm compress Keflex  500 mg tid for 7 d   Update if not starting to improve in a week or if worsening (see AVS)  Call back and Er precautions noted in detail today   Work note given /is uncomfortable to sit        Relevant Medications   cephALEXin  (KEFLEX ) 500 MG capsule

## 2024-07-03 NOTE — Assessment & Plan Note (Addendum)
 Area of erythema and induration in  skin of lower scrotum at midline 1-1.5 cm , firm /not fluctuant  In pt with history of recurrent boils in groin and buttocks area  Uses topical clinda solution prn-this time not helping  Encouraged to continue warm compress Keflex  500 mg tid for 7 d   Update if not starting to improve in a week or if worsening (see AVS)  Call back and Er precautions noted in detail today   Work note given /is uncomfortable to sit

## 2024-07-03 NOTE — Patient Instructions (Addendum)
 Keep affected area clean with soap and water   Warm compresses may help  Watch for worsening redness/swelling or fever   Update us  if not starting to improve in the next several days

## 2024-07-09 ENCOUNTER — Ambulatory Visit: Payer: Self-pay

## 2024-07-09 ENCOUNTER — Other Ambulatory Visit: Payer: Self-pay | Admitting: Family Medicine

## 2024-07-09 MED ORDER — CEPHALEXIN 500 MG PO CAPS: 500.0000 mg | ORAL_CAPSULE | Freq: Three times a day (TID) | ORAL | 0 refills | Status: AC

## 2024-07-09 NOTE — Telephone Encounter (Signed)
 Since it is improved but not gone I sent in 5 more days of keflex   Let us  know if this does not improve things more  Also if worse at any time let us  know  ER/ UC precautions if severe

## 2024-07-09 NOTE — Telephone Encounter (Addendum)
 First attempt no answer Second attempt; no answer Third attempt; no answer; forwarded to office        Copied from CRM [id number]. Topic: Clinical - Medical Advice >> Jul 09, 2024 12:50 PM Suzen RAMAN wrote: Reason for CRM: Patient was recently seen for an infection on his scrotum. Patient has one day left of antibiotic. Area has improved but has not cleared up completely. Patient would like to know if he needs to be seen again or what he needs to do going forward.  CB#[phone]

## 2024-07-09 NOTE — Telephone Encounter (Signed)
 Called cell# on file and the # is off, called home # on file and no answer so left VM requesting pt to call the office back

## 2024-07-11 NOTE — Telephone Encounter (Signed)
 Called cell# on file and the # is off, called home # on file and no answer and can't leave a VM

## 2024-07-12 NOTE — Telephone Encounter (Signed)
 I have tried to call [phone] and the phone # is [phone] and I can't leave a VM, if pt calls back I will address it then.
